# Patient Record
Sex: Male | Born: 1975 | Race: White | Hispanic: No | Marital: Married | Smoking: Never smoker
Health system: Southern US, Community
[De-identification: ages and names within clinical notes are randomized; demographics above are authoritative.]

## PROBLEM LIST (undated history)

## (undated) DIAGNOSIS — E559 Vitamin D deficiency, unspecified: Secondary | ICD-10-CM

## (undated) DIAGNOSIS — G4733 Obstructive sleep apnea (adult) (pediatric): Secondary | ICD-10-CM

## (undated) DIAGNOSIS — E291 Testicular hypofunction: Secondary | ICD-10-CM

## (undated) DIAGNOSIS — Z9989 Dependence on other enabling machines and devices: Secondary | ICD-10-CM

## (undated) DIAGNOSIS — R7989 Other specified abnormal findings of blood chemistry: Secondary | ICD-10-CM

## (undated) DIAGNOSIS — I10 Essential (primary) hypertension: Secondary | ICD-10-CM

## (undated) DIAGNOSIS — G43909 Migraine, unspecified, not intractable, without status migrainosus: Secondary | ICD-10-CM

## (undated) DIAGNOSIS — E669 Obesity, unspecified: Secondary | ICD-10-CM

## (undated) DIAGNOSIS — F419 Anxiety disorder, unspecified: Secondary | ICD-10-CM

## (undated) DIAGNOSIS — R945 Abnormal results of liver function studies: Secondary | ICD-10-CM

---

## 2001-08-28 ENCOUNTER — Emergency Department (HOSPITAL_COMMUNITY): Admission: EM | Admit: 2001-08-28 | Discharge: 2001-08-28 | Payer: Self-pay | Admitting: *Deleted

## 2008-02-05 ENCOUNTER — Emergency Department (HOSPITAL_COMMUNITY): Admission: EM | Admit: 2008-02-05 | Discharge: 2008-02-05 | Payer: Self-pay | Admitting: Emergency Medicine

## 2010-12-01 LAB — CBC
HCT: 43.1 % (ref 39.0–52.0)
MCV: 97.1 fL (ref 78.0–100.0)
RBC: 4.44 MIL/uL (ref 4.22–5.81)
WBC: 10.6 10*3/uL — ABNORMAL HIGH (ref 4.0–10.5)

## 2010-12-01 LAB — URINALYSIS, ROUTINE W REFLEX MICROSCOPIC
Bilirubin Urine: NEGATIVE
Hgb urine dipstick: NEGATIVE
Protein, ur: NEGATIVE mg/dL
Urobilinogen, UA: 0.2 mg/dL (ref 0.0–1.0)

## 2010-12-01 LAB — DIFFERENTIAL
Eosinophils Absolute: 0 10*3/uL (ref 0.0–0.7)
Eosinophils Relative: 0 % (ref 0–5)
Lymphs Abs: 1.5 10*3/uL (ref 0.7–4.0)
Monocytes Absolute: 1 10*3/uL (ref 0.1–1.0)
Monocytes Relative: 10 % (ref 3–12)

## 2010-12-01 LAB — POCT I-STAT, CHEM 8
Creatinine, Ser: 0.9 mg/dL (ref 0.4–1.5)
Glucose, Bld: 131 mg/dL — ABNORMAL HIGH (ref 70–99)
Hemoglobin: 16 g/dL (ref 13.0–17.0)
TCO2: 24 mmol/L (ref 0–100)

## 2014-10-01 ENCOUNTER — Emergency Department (HOSPITAL_COMMUNITY): Payer: Self-pay

## 2014-10-01 ENCOUNTER — Inpatient Hospital Stay (HOSPITAL_COMMUNITY)
Admission: EM | Admit: 2014-10-01 | Discharge: 2014-10-04 | DRG: 102 | Disposition: A | Discharge status: Home / Self Care | Payer: Self-pay | Attending: Neurology | Admitting: Neurology

## 2014-10-01 ENCOUNTER — Inpatient Hospital Stay (HOSPITAL_COMMUNITY): Payer: Self-pay | Admitting: Anesthesiology

## 2014-10-01 ENCOUNTER — Inpatient Hospital Stay (HOSPITAL_COMMUNITY): Payer: Self-pay

## 2014-10-01 ENCOUNTER — Encounter (HOSPITAL_COMMUNITY): Payer: Self-pay | Admitting: Emergency Medicine

## 2014-10-01 ENCOUNTER — Encounter (HOSPITAL_COMMUNITY)
Admission: EM | Disposition: A | Discharge status: Home / Self Care | Payer: Self-pay | Source: Home / Self Care | Attending: Neurology

## 2014-10-01 DIAGNOSIS — M542 Cervicalgia: Secondary | ICD-10-CM | POA: Diagnosis present

## 2014-10-01 DIAGNOSIS — G459 Transient cerebral ischemic attack, unspecified: Secondary | ICD-10-CM

## 2014-10-01 DIAGNOSIS — J9600 Acute respiratory failure, unspecified whether with hypoxia or hypercapnia: Secondary | ICD-10-CM

## 2014-10-01 DIAGNOSIS — E291 Testicular hypofunction: Secondary | ICD-10-CM | POA: Diagnosis present

## 2014-10-01 DIAGNOSIS — R51 Headache: Secondary | ICD-10-CM

## 2014-10-01 DIAGNOSIS — Z79899 Other long term (current) drug therapy: Secondary | ICD-10-CM

## 2014-10-01 DIAGNOSIS — R4182 Altered mental status, unspecified: Secondary | ICD-10-CM | POA: Diagnosis present

## 2014-10-01 DIAGNOSIS — E669 Obesity, unspecified: Secondary | ICD-10-CM | POA: Diagnosis present

## 2014-10-01 DIAGNOSIS — G43911 Migraine, unspecified, intractable, with status migrainosus: Secondary | ICD-10-CM

## 2014-10-01 DIAGNOSIS — F419 Anxiety disorder, unspecified: Secondary | ICD-10-CM | POA: Diagnosis present

## 2014-10-01 DIAGNOSIS — G4733 Obstructive sleep apnea (adult) (pediatric): Secondary | ICD-10-CM | POA: Diagnosis present

## 2014-10-01 DIAGNOSIS — G43111 Migraine with aura, intractable, with status migrainosus: Principal | ICD-10-CM | POA: Diagnosis present

## 2014-10-01 DIAGNOSIS — T43615A Adverse effect of caffeine, initial encounter: Secondary | ICD-10-CM | POA: Diagnosis present

## 2014-10-01 DIAGNOSIS — Z7982 Long term (current) use of aspirin: Secondary | ICD-10-CM

## 2014-10-01 DIAGNOSIS — T50995A Adverse effect of other drugs, medicaments and biological substances, initial encounter: Secondary | ICD-10-CM | POA: Diagnosis present

## 2014-10-01 DIAGNOSIS — J95821 Acute postprocedural respiratory failure: Secondary | ICD-10-CM | POA: Diagnosis present

## 2014-10-01 DIAGNOSIS — I67841 Reversible cerebrovascular vasoconstriction syndrome: Secondary | ICD-10-CM

## 2014-10-01 DIAGNOSIS — I10 Essential (primary) hypertension: Secondary | ICD-10-CM | POA: Diagnosis present

## 2014-10-01 DIAGNOSIS — R4701 Aphasia: Secondary | ICD-10-CM

## 2014-10-01 DIAGNOSIS — I67848 Other cerebrovascular vasospasm and vasoconstriction: Secondary | ICD-10-CM

## 2014-10-01 DIAGNOSIS — R531 Weakness: Secondary | ICD-10-CM

## 2014-10-01 DIAGNOSIS — R519 Headache, unspecified: Secondary | ICD-10-CM

## 2014-10-01 DIAGNOSIS — R202 Paresthesia of skin: Secondary | ICD-10-CM | POA: Diagnosis present

## 2014-10-01 DIAGNOSIS — Z9119 Patient's noncompliance with other medical treatment and regimen: Secondary | ICD-10-CM | POA: Diagnosis present

## 2014-10-01 DIAGNOSIS — E876 Hypokalemia: Secondary | ICD-10-CM | POA: Diagnosis present

## 2014-10-01 HISTORY — DX: Essential (primary) hypertension: I10

## 2014-10-01 HISTORY — DX: Migraine, unspecified, not intractable, without status migrainosus: G43.909

## 2014-10-01 HISTORY — PX: RADIOLOGY WITH ANESTHESIA: SHX6223

## 2014-10-01 HISTORY — DX: Testicular hypofunction: E29.1

## 2014-10-01 HISTORY — DX: Obstructive sleep apnea (adult) (pediatric): G47.33

## 2014-10-01 HISTORY — DX: Anxiety disorder, unspecified: F41.9

## 2014-10-01 HISTORY — DX: Obesity, unspecified: E66.9

## 2014-10-01 HISTORY — DX: Other specified abnormal findings of blood chemistry: R79.89

## 2014-10-01 HISTORY — DX: Abnormal results of liver function studies: R94.5

## 2014-10-01 HISTORY — DX: Dependence on other enabling machines and devices: Z99.89

## 2014-10-01 HISTORY — DX: Vitamin D deficiency, unspecified: E55.9

## 2014-10-01 LAB — COMPREHENSIVE METABOLIC PANEL
ALT: 151 U/L — ABNORMAL HIGH (ref 17–63)
ANION GAP: 13 (ref 5–15)
AST: 92 U/L — AB (ref 15–41)
Albumin: 4.3 g/dL (ref 3.5–5.0)
Alkaline Phosphatase: 42 U/L (ref 38–126)
BUN: 11 mg/dL (ref 6–20)
CALCIUM: 9.6 mg/dL (ref 8.9–10.3)
CHLORIDE: 103 mmol/L (ref 101–111)
CO2: 22 mmol/L (ref 22–32)
CREATININE: 1.04 mg/dL (ref 0.61–1.24)
GFR calc Af Amer: >60 mL/min (ref >60)
GLUCOSE: 140 mg/dL — AB (ref 65–99)
Potassium: 3.6 mmol/L (ref 3.5–5.1)
SODIUM: 138 mmol/L (ref 135–145)
Total Bilirubin: 1.5 mg/dL — ABNORMAL HIGH (ref 0.3–1.2)
Total Protein: 7.6 g/dL (ref 6.5–8.1)

## 2014-10-01 LAB — DIFFERENTIAL
Basophils Absolute: 0 10*3/uL (ref 0.0–0.1)
Basophils Relative: 0 % (ref 0–1)
EOS ABS: 0.1 10*3/uL (ref 0.0–0.7)
EOS PCT: 1 % (ref 0–5)
LYMPHS PCT: 26 % (ref 12–46)
Lymphs Abs: 2.7 10*3/uL (ref 0.7–4.0)
MONOS PCT: 13 % — AB (ref 3–12)
Monocytes Absolute: 1.3 10*3/uL — ABNORMAL HIGH (ref 0.1–1.0)
NEUTROS PCT: 60 % (ref 43–77)
Neutro Abs: 6 10*3/uL (ref 1.7–7.7)

## 2014-10-01 LAB — BLOOD GAS, ARTERIAL
Acid-base deficit: 2 mmol/L (ref 0.0–2.0)
Bicarbonate: 23.2 mEq/L (ref 20.0–24.0)
Drawn by: 252031
FIO2: 1
LHR: 15 {breaths}/min
MECHVT: 620 mL
O2 Saturation: 99.7 %
PEEP: 5 cmH2O
PO2 ART: 322 mmHg — AB (ref 80.0–100.0)
Patient temperature: 98.6
TCO2: 24.6 mmol/L (ref 0–100)
pCO2 arterial: 46.5 mmHg — ABNORMAL HIGH (ref 35.0–45.0)
pH, Arterial: 7.319 — ABNORMAL LOW (ref 7.350–7.450)

## 2014-10-01 LAB — MRSA PCR SCREENING: MRSA BY PCR: NEGATIVE

## 2014-10-01 LAB — I-STAT CHEM 8, ED
BUN: 11 mg/dL (ref 6–20)
Calcium, Ion: 1.06 mmol/L — ABNORMAL LOW (ref 1.12–1.23)
Chloride: 100 mmol/L — ABNORMAL LOW (ref 101–111)
Creatinine, Ser: 1 mg/dL (ref 0.61–1.24)
Glucose, Bld: 144 mg/dL — ABNORMAL HIGH (ref 65–99)
HCT: 51 % (ref 39.0–52.0)
Hemoglobin: 17.3 g/dL — ABNORMAL HIGH (ref 13.0–17.0)
POTASSIUM: 3.4 mmol/L — AB (ref 3.5–5.1)
Sodium: 138 mmol/L (ref 135–145)
TCO2: 19 mmol/L (ref 0–100)

## 2014-10-01 LAB — PROTIME-INR
INR: 1.06 (ref 0.00–1.49)
PROTHROMBIN TIME: 14 s (ref 11.6–15.2)

## 2014-10-01 LAB — I-STAT TROPONIN, ED: Troponin i, poc: 0 ng/mL (ref 0.00–0.08)

## 2014-10-01 LAB — CBC
HEMATOCRIT: 47.8 % (ref 39.0–52.0)
Hemoglobin: 16.8 g/dL (ref 13.0–17.0)
MCH: 35.2 pg — ABNORMAL HIGH (ref 26.0–34.0)
MCHC: 35.1 g/dL (ref 30.0–36.0)
MCV: 100.2 fL — ABNORMAL HIGH (ref 78.0–100.0)
Platelets: 216 10*3/uL (ref 150–400)
RBC: 4.77 MIL/uL (ref 4.22–5.81)
RDW: 13.3 % (ref 11.5–15.5)
WBC: 10.1 10*3/uL (ref 4.0–10.5)

## 2014-10-01 LAB — APTT: APTT: 26 s (ref 24–37)

## 2014-10-01 LAB — ETHANOL: Alcohol, Ethyl (B): <5 mg/dL (ref <5)

## 2014-10-01 SURGERY — RADIOLOGY WITH ANESTHESIA
Anesthesia: General

## 2014-10-01 MED ORDER — SUCCINYLCHOLINE CHLORIDE 20 MG/ML IJ SOLN
INTRAMUSCULAR | Status: DC | PRN
  Administered 2014-10-01: 140 mg via INTRAVENOUS

## 2014-10-01 MED ORDER — FENTANYL CITRATE (PF) 100 MCG/2ML IJ SOLN
INTRAMUSCULAR | Status: DC | PRN
  Administered 2014-10-01: 200 ug via INTRAVENOUS
  Administered 2014-10-01: 100 ug via INTRAVENOUS
  Administered 2014-10-01: 150 ug via INTRAVENOUS
  Administered 2014-10-01: 100 ug via INTRAVENOUS

## 2014-10-01 MED ORDER — NICARDIPINE HCL IN NACL 20-0.86 MG/200ML-% IV SOLN
5.0000 mg/h | INTRAVENOUS | Status: DC
  Filled 2014-10-01: qty 200

## 2014-10-01 MED ORDER — HEPARIN SODIUM (PORCINE) 1000 UNIT/ML IJ SOLN
INTRAMUSCULAR | Status: DC | PRN
  Administered 2014-10-01: 3000 [IU] via INTRAVENOUS
  Administered 2014-10-01: 1000 [IU] via INTRAVENOUS

## 2014-10-01 MED ORDER — FENTANYL CITRATE (PF) 100 MCG/2ML IJ SOLN
100.0000 ug | INTRAMUSCULAR | Status: DC | PRN
Start: 2014-10-01 — End: 2014-10-02

## 2014-10-01 MED ORDER — PROCHLORPERAZINE EDISYLATE 5 MG/ML IJ SOLN
10.0000 mg | Freq: Once | INTRAMUSCULAR | Status: AC
  Administered 2014-10-01: 10 mg via INTRAVENOUS
  Filled 2014-10-01: qty 2

## 2014-10-01 MED ORDER — GADOBENATE DIMEGLUMINE 529 MG/ML IV SOLN
20.0000 mL | Freq: Once | INTRAVENOUS | Status: AC | PRN
  Administered 2014-10-01: 20 mL via INTRAVENOUS

## 2014-10-01 MED ORDER — ONDANSETRON HCL 4 MG/2ML IJ SOLN
4.0000 mg | Freq: Once | INTRAMUSCULAR | Status: AC
  Administered 2014-10-01: 4 mg via INTRAVENOUS
  Filled 2014-10-01: qty 2

## 2014-10-01 MED ORDER — SODIUM CHLORIDE 0.9 % IV SOLN
INTRAVENOUS | Status: DC | PRN
  Administered 2014-10-01: 17:00:00 via INTRAVENOUS

## 2014-10-01 MED ORDER — LIDOCAINE HCL (CARDIAC) 20 MG/ML IV SOLN
INTRAVENOUS | Status: DC | PRN
  Administered 2014-10-01: 80 mg via INTRAVENOUS

## 2014-10-01 MED ORDER — HYDROMORPHONE HCL 1 MG/ML IJ SOLN
1.0000 mg | Freq: Once | INTRAMUSCULAR | Status: DC
  Filled 2014-10-01: qty 1

## 2014-10-01 MED ORDER — VERAPAMIL HCL 2.5 MG/ML IV SOLN
INTRAVENOUS | Status: DC
  Filled 2014-10-01 (×18): qty 50

## 2014-10-01 MED ORDER — DEXTROSE 5 % IV SOLN
10.0000 mg | INTRAVENOUS | Status: DC | PRN
  Administered 2014-10-01: 15 ug/min via INTRAVENOUS

## 2014-10-01 MED ORDER — SODIUM CHLORIDE 0.9 % IV SOLN: INTRAVENOUS | Status: DC

## 2014-10-01 MED ORDER — LORAZEPAM 2 MG/ML IJ SOLN
2.0000 mg | Freq: Once | INTRAMUSCULAR | Status: AC
  Administered 2014-10-01: 2 mg via INTRAVENOUS
  Filled 2014-10-01: qty 1

## 2014-10-01 MED ORDER — PROPOFOL 1000 MG/100ML IV EMUL
0.0000 ug/kg/min | INTRAVENOUS | Status: DC
  Administered 2014-10-01: 50 ug/kg/min via INTRAVENOUS
  Administered 2014-10-01: 20 ug/kg/min via INTRAVENOUS
  Administered 2014-10-02: 45 ug/kg/min via INTRAVENOUS
  Administered 2014-10-02: 50 ug/kg/min via INTRAVENOUS
  Filled 2014-10-01 (×3): qty 100

## 2014-10-01 MED ORDER — VANCOMYCIN HCL IN DEXTROSE 1-5 GM/200ML-% IV SOLN
1000.0000 mg | INTRAVENOUS | Status: AC
  Administered 2014-10-01: 1000 mg via INTRAVENOUS
  Filled 2014-10-01 (×2): qty 200

## 2014-10-01 MED ORDER — ACETAMINOPHEN 500 MG PO TABS
1000.0000 mg | ORAL_TABLET | Freq: Four times a day (QID) | ORAL | Status: DC | PRN
  Administered 2014-10-02 – 2014-10-04 (×2): 1000 mg via ORAL
  Filled 2014-10-01 (×2): qty 2

## 2014-10-01 MED ORDER — PROPOFOL 10 MG/ML IV BOLUS
INTRAVENOUS | Status: DC | PRN
  Administered 2014-10-01: 200 mg via INTRAVENOUS
  Administered 2014-10-01: 20 mg via INTRAVENOUS
  Administered 2014-10-01: 100 mg via INTRAVENOUS

## 2014-10-01 MED ORDER — ONDANSETRON HCL 4 MG/2ML IJ SOLN: 4.0000 mg | Freq: Four times a day (QID) | INTRAMUSCULAR | Status: DC | PRN

## 2014-10-01 MED ORDER — MAGNESIUM SULFATE IN D5W 10-5 MG/ML-% IV SOLN
1.0000 g | Freq: Once | INTRAVENOUS | Status: AC
  Administered 2014-10-01: 1 g via INTRAVENOUS
  Filled 2014-10-01: qty 100

## 2014-10-01 MED ORDER — SODIUM CHLORIDE 0.9 % IV SOLN: INTRAVENOUS | Status: DC | PRN

## 2014-10-01 MED ORDER — DIPHENHYDRAMINE HCL 50 MG/ML IJ SOLN: 25.0000 mg | Freq: Three times a day (TID) | INTRAMUSCULAR | Status: DC | PRN

## 2014-10-01 MED ORDER — SODIUM CHLORIDE 0.9 % IV SOLN
INTRAVENOUS | Status: DC
  Administered 2014-10-01 – 2014-10-03 (×3): via INTRAVENOUS

## 2014-10-01 MED ORDER — PANTOPRAZOLE SODIUM 40 MG IV SOLR
40.0000 mg | Freq: Every day | INTRAVENOUS | Status: DC
  Administered 2014-10-02 (×2): 40 mg via INTRAVENOUS
  Filled 2014-10-01 (×2): qty 40

## 2014-10-01 MED ORDER — LIDOCAINE HCL 1 % IJ SOLN
INTRAMUSCULAR | Status: AC
  Filled 2014-10-01: qty 20

## 2014-10-01 MED ORDER — PROPOFOL 1000 MG/100ML IV EMUL
INTRAVENOUS | Status: AC
  Filled 2014-10-01: qty 100

## 2014-10-01 MED ORDER — DIPHENHYDRAMINE HCL 50 MG/ML IJ SOLN
50.0000 mg | Freq: Once | INTRAMUSCULAR | Status: AC
  Administered 2014-10-01: 50 mg via INTRAVENOUS
  Filled 2014-10-01: qty 1

## 2014-10-01 MED ORDER — IOHEXOL 300 MG/ML  SOLN
150.0000 mL | Freq: Once | INTRAMUSCULAR | Status: AC | PRN
  Administered 2014-10-01: 100 mL via INTRA_ARTERIAL

## 2014-10-01 MED ORDER — ROCURONIUM BROMIDE 100 MG/10ML IV SOLN
INTRAVENOUS | Status: DC | PRN
  Administered 2014-10-01 (×2): 50 mg via INTRAVENOUS

## 2014-10-01 MED ORDER — ENOXAPARIN SODIUM 40 MG/0.4ML ~~LOC~~ SOLN
40.0000 mg | SUBCUTANEOUS | Status: DC
  Administered 2014-10-02 – 2014-10-03 (×3): 40 mg via SUBCUTANEOUS
  Filled 2014-10-01 (×5): qty 0.4

## 2014-10-01 MED ORDER — SODIUM CHLORIDE 0.9 % IV BOLUS (SEPSIS)
1000.0000 mL | Freq: Once | INTRAVENOUS | Status: AC
  Administered 2014-10-01: 1000 mL via INTRAVENOUS

## 2014-10-01 MED ORDER — METOCLOPRAMIDE HCL 5 MG/ML IJ SOLN
10.0000 mg | Freq: Three times a day (TID) | INTRAMUSCULAR | Status: DC | PRN
  Filled 2014-10-01: qty 2

## 2014-10-01 MED ORDER — ACETAMINOPHEN 650 MG RE SUPP: 650.0000 mg | Freq: Four times a day (QID) | RECTAL | Status: DC | PRN

## 2014-10-01 MED ORDER — STROKE: EARLY STAGES OF RECOVERY BOOK
Freq: Once | Status: DC
  Filled 2014-10-01: qty 1

## 2014-10-01 MED ORDER — FENTANYL CITRATE (PF) 100 MCG/2ML IJ SOLN
100.0000 ug | INTRAMUSCULAR | Status: DC | PRN
  Administered 2014-10-01 (×2): 100 ug via INTRAVENOUS
  Filled 2014-10-01 (×2): qty 2

## 2014-10-01 MED ORDER — SENNOSIDES-DOCUSATE SODIUM 8.6-50 MG PO TABS
1.0000 | ORAL_TABLET | Freq: Every evening | ORAL | Status: DC | PRN
  Filled 2014-10-01: qty 1

## 2014-10-01 NOTE — Code Documentation (Signed)
39 year old male presents to [facility] as code stroke.  Called in field by EMS.  Family reports patient has been having intermittent sx of headache, bil arm numbness and tingling for 8 days.  Has been seeing PCP PA who has been treating him for migraines.  He has hx of migraines in his 20's but none in last 10 years or so.  Mother reports that this AM around 0930  he was having trouble finding his words and c/o headache.  Patient with expressive aphasia - seems to understand and follow commands but very anxious and grabbing head in pain.  He is unable to clearly inform us of his state this AM with waking - family clearly endorsing on and off sx for 8 days - therefore LSW unable to be determined.  Focal deficits of aphasia - missed the questions and possibly some sensation loss left side.  NIHSS scored 5.  Treated with Benadryl and Compazine for migraine.  Some short term relief.  Family reports new medicine Relpax started yesterday.  Code stroke cancelled per Dr. Hosie Poisson -  Given 2 mg Ativan IV for MRI - resting quietly -  transported to MRI - handoff to [employer].

## 2014-10-01 NOTE — ED Notes (Signed)
Pt from home via [facility] with c/o migraine x 8 days.  Pt denies pain at this time but has been mute since arrival.  EMS reports pt has intermittently been able to state presidents name.  LSW 0900.  Pt mother reports pt stated he had severe neck and head pain starting last night with bilateral upper and lower extremity numbness.  Hx HTN and migraines.  Pt in NAD, alert.  When asked orientation questions by this RN pt's response is to shake head "no" and states "I can't."

## 2014-10-01 NOTE — H&P (Signed)
Stroke Consult   Chief Complaint: headache, aphasia, weakness  HPI: Reginald Gray is an 39 y.o. male migraines, hypertension, anxiety presenting with 1.5 week history of severe generalized pounding headache refractory to multiple medications. Has associated nausea, photo and phonophobia. Denies any visual changes. Yesterday developed numbness and paresthesias in bilateral arms R>L. This continued today. Around 0930 today his mom noted that he was having speech difficulties. Described as difficulty getting words out and confusion. EMS notified and code stroke activated.   Patient has been treated by his PCP for his headache. Has received IV toradol and Relpax x 2 with no symptomatic benefit. CT head imaging reviewed and overall unremarkable with no acute process. In the ED received benadryl  x 1 and compazine  x 1.   Date last known well: 08/05 Time last known well: unclear tPA Given: no, outside tPA window Modified Rankin: Rankin Score=0  Past Medical History  Diagnosis Date  . Migraines   . Hypertension   . Low testosterone   . Vitamin D deficiency disease   . Anxiety   . Elevated LFTs   . Obesity   . Hypogonadism, male   . OSA on CPAP     No past surgical history on file.  No family history on file. Social History:  has no tobacco, alcohol, and drug history on file.  Allergies: No Known Allergies   (Not in a hospital admission)  ROS: Out of a complete 14 system review, the patient complains of only the following symptoms, and all other reviewed systems are negative. + headache, weakness  Physical Examination: Filed Vitals:   10/01/14 1142  BP: 185/87  Pulse: 65  Temp: 97.3 F (36.3 C)  Resp: 22   Physical Exam  Constitutional: He appears well-developed and well-nourished.  Psych: Affect appropriate to situation Eyes: No scleral injection HENT: No OP obstrucion Head: Normocephalic.  Cardiovascular: Normal  rate and regular rhythm.  Respiratory: Effort normal and breath sounds normal.  GI: Soft. Bowel sounds are normal. No distension. There is no tenderness.  Skin: WDI  Neurologic Examination: Mental Status: Agitated but alert, oriented to name only, intermittently appropriate thought though with some expressive and receptive aphasia. Intermittently will follow commands Cranial Nerves: II: unable to visualize optic discs due to photosensitivity, visual fields grossly normal, pupils equal, round, reactive to light  III,IV, VI: ptosis not present, extra-ocular motions intact bilaterally V,VII: smile symmetric, facial light touch sensation normal bilaterally VIII: hearing normal bilaterally IX,X: gag reflex present XI: trapezius strength/neck flexion strength normal bilaterally XII: tongue strength normal  Motor: Right :Upper extremityLeft: Upper extremity 5-/5 deltoid 5/5 deltoid 5-/5 biceps5/5 biceps  5-/5 triceps5/5 triceps 5-/5 hand grip5/5 hand grip Lower extremityLower extremity 5/5 hip flexor5/5 hip flexor 5/5 quadricep5/5 quadriceps  5/5 hamstrings5/5 hamstrings 5/5 plantar flexion 5/5 plantar flexion 5/5 plantar extension5/5 plantar extension Tone and bulk:normal tone throughout; no atrophy noted Sensory: decreased LT on  the right side Deep Tendon Reflexes: 2+ and symmetric throughout Plantars: Right: downgoingLeft: downgoing Cerebellar: Unable to test (not following commands) Gait: deferred  Laboratory Studies:  Basic Metabolic Panel:  Last Labs     No results for input(s): NA, K, CL, CO2, GLUCOSE, BUN, CREATININE, CALCIUM, MG, PHOS in the last 168 hours.    Liver Function Tests:  Last Labs     No results for input(s): AST, ALT, ALKPHOS, BILITOT, PROT, ALBUMIN in the last 168 hours.    Last Labs     No results for input(s): LIPASE, AMYLASE  in the last 168 hours.    Last Labs     No results for input(s): AMMONIA in the last 168 hours.    CBC:  Last Labs     No results for input(s): WBC, NEUTROABS, HGB, HCT, MCV, PLT in the last 168 hours.    Cardiac Enzymes:  Last Labs     No results for input(s): CKTOTAL, CKMB, CKMBINDEX, TROPONINI in the last 168 hours.    BNP:  Last Labs     Invalid input(s): POCBNP    CBG:  Last Labs     No results for input(s): GLUCAP in the last 168 hours.    Microbiology: No results found for this or any previous visit.  Coagulation Studies:  Recent Labs (last 2 labs)     No results for input(s): LABPROT, INR in the last 72 hours.     Last Labs     Urinalysis: No results for input(s): COLORURINE, LABSPEC, PHURINE, GLUCOSEU, HGBUR, BILIRUBINUR, KETONESUR, PROTEINUR, UROBILINOGEN, NITRITE, LEUKOCYTESUR in the last 168 hours.  Invalid input(s): APPERANCEUR    Lipid Panel:   Labs (Brief)    No results found for: CHOL, TRIG, HDL, CHOLHDL, VLDL, LDLCALC    HgbA1C:    Recent Labs    No results found for: HGBA1C    Urine Drug Screen:    Labs (Brief)    No results found for: LABOPIA, COCAINSCRNUR, LABBENZ, AMPHETMU, THCU, LABBARB    Alcohol Level:    Last Labs     No results for input(s): ETH in the last 168 hours.    Other results:  Imaging:  Imaging Results (Last 48 hours)    No results found.     Assessment: 39 y.o. male hx of migraines, hypertension, anxiety presenting with 1.5 week history of severe headache now with new onset of sensory, motor and speech deficits. Unclear etiology. Differential includes CVA vs complex migraine vs Dural Sinus thrombosis. Onset of neurological deficits after addition of relpax raises question of possible reversible vasoconstriction syndrome. A febrile with no leukocytosis makes infectious etiology less likely.   -Will order stat MRI with and without, MRA head and MRV brain.  -Agree with benadryl and compazine. Will order magnesium sulfate 1gm IV x 1 -Would hold on DHE or triptan therapy pending clarification of etiology of symptoms -Further workup and treatment pending imaging results   Addendum: Reviewed imaging results with neuroradiologist, Dr Dorena Dew. No signs of acute infarct or dural sinus thrombosis. 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 Concern for possible reversible cerebral vasoconstriction syndrome. Discussed with neuro-IR, Dr Corliss Skains. Will proceed with diagnostic angiogram, consider further intervention based on imaging results. Will admit to neuro-ICU and monitor closely.    This patient is critically ill and at significant risk of neurological worsening, death and care requires constant monitoring of vital signs, hemodynamics,respiratory and cardiac monitoring,review of multiple databases, neurological assessment, discussion with family, other specialists and medical decision making of high complexity. I spent 45 minutes of neurocritical care time in the care of this patient.    Elspeth Cho, DO [facility] [phone]  If 7pm- 7am, please page neurology on call as listed in AMION. 10/01/2014, 12:22 PM

## 2014-10-01 NOTE — Transfer of Care (Signed)
Immediate Anesthesia Transfer of Care Note  Patient: Reginald Gray  Procedure(s) Performed: Procedure(s): RADIOLOGY WITH ANESTHESIA (N/A)  Patient Location: ICU  Anesthesia Type:General  Level of Consciousness: sedated and Patient remains intubated per anesthesia plan  Airway & Oxygen Therapy: Patient remains intubated per anesthesia plan and Patient placed on Ventilator (see vital sign flow sheet for setting)  Post-op Assessment: Report given to RN and Post -op Vital signs reviewed and stable  Post vital signs: Reviewed and stable  Last Vitals:  Filed Vitals:   10/01/14 1700  BP: 176/83  Pulse: 76  Temp:   Resp:     Complications: No apparent anesthesia complications

## 2014-10-01 NOTE — Anesthesia Preprocedure Evaluation (Addendum)
Anesthesia Evaluation  Patient identified by MRN, date of birth, ID band Patient confused    Reviewed: Unable to perform ROS - Chart review onlyPreop documentation limited or incomplete due to emergent nature of procedure.  Airway       Comment: Obese head and neck.  Unable to fully cooperate with exam. Dental   Pulmonary sleep apnea ,  breath sounds clear to auscultation        Cardiovascular hypertension, Rhythm:regular Rate:Normal     Neuro/Psych  Headaches, Anxiety CVA    GI/Hepatic   Endo/Other    Renal/GU      Musculoskeletal   Abdominal   Peds  Hematology   Anesthesia Other Findings   Reproductive/Obstetrics                            Anesthesia Physical Anesthesia Plan  ASA: III and emergent  Anesthesia Plan: General   Post-op Pain Management:    Induction: Intravenous  Airway Management Planned: Oral ETT  Additional Equipment: Arterial line  Intra-op Plan:   Post-operative Plan: Extubation in OR and Possible Post-op intubation/ventilation  Informed Consent: I have reviewed the patients History and Physical, chart, labs and discussed the procedure including the risks, benefits and alternatives for the proposed anesthesia with the patient or authorized representative who has indicated his/her understanding and acceptance.     Plan Discussed with: CRNA, Anesthesiologist and Surgeon  Anesthesia Plan Comments:         Anesthesia Quick Evaluation

## 2014-10-01 NOTE — ED Notes (Signed)
Notified Dr Adela Lank of pt status.

## 2014-10-01 NOTE — Procedures (Signed)
S/P 4 vessel cerebral arteriogram   RT CFA approach. Findings . to moderate delayed  Hemodynamic  flow into both MCAs ,parietal branches , s/p IA administration of 4.5 mg of Verapamil into Lt ICA and 2.4 mg into RT ICA with sig improved hemodynamic flow.

## 2014-10-01 NOTE — Sedation Documentation (Signed)
Verapamil 2.4mg  given via Right internal carotid artery, and 4.5mg  given via Left internal  carotid artery.

## 2014-10-01 NOTE — ED Provider Notes (Signed)
CSN: [id number]     Arrival date & time 10/01/14  1126 History   First MD Initiated Contact with Patient 10/01/14 1142     Chief Complaint  Patient presents with  . Code Stroke  . Aphasia  . Migraine     (Consider location/radiation/quality/duration/timing/severity/associated sxs/prior Treatment) Patient is a 39 y.o. male presenting with headaches. The history is provided by the patient.  Headache Pain location:  Generalized Quality:  Sharp Radiates to:  Does not radiate Severity currently:  10/10 Severity at highest:  10/10 Onset quality:  Gradual Duration:  2 weeks Timing:  Constant Progression:  Worsening Chronicity:  New Similar to prior headaches: no   Context: bright light   Relieved by:  Nothing Worsened by:  Nothing Ineffective treatments:  None tried Associated symptoms: neck pain, paresthesias and tingling   Associated symptoms: no abdominal pain, no congestion, no diarrhea, no fever, no myalgias, no visual change and no vomiting    39 yo M with a chief complaint of headache. This been going on for at least 10 days. Patient saw his PCP 3 days ago was given 2 shots with some mild improvement. This morning patient suddenly had difficulty speaking was complaining of paresthesias generally just before that happened. Having severe photophobia and severe worsening of headache as well. Family states that he has had migraines in the past but they resolved in his 30s. Family denies head injury. History of hypertension denies history of diabetes hyperlipidemia.  Past Medical History  Diagnosis Date  . Migraines   . Hypertension   . Low testosterone   . Vitamin D deficiency disease   . Anxiety   . Elevated LFTs   . Obesity   . Hypogonadism, male   . OSA on CPAP    No past surgical history on file. No family history on file. History  Substance Use Topics  . Smoking status: Not on file  . Smokeless tobacco: Not on file  . Alcohol Use: Not on file    Review of  Systems  Unable to perform ROS: Patient nonverbal  Constitutional: Negative for fever and chills.  HENT: Negative for congestion and facial swelling.   Eyes: Negative for discharge and visual disturbance.  Respiratory: Negative for shortness of breath.   Cardiovascular: Negative for chest pain and palpitations.  Gastrointestinal: Negative for vomiting, abdominal pain and diarrhea.  Musculoskeletal: Positive for neck pain. Negative for myalgias and arthralgias.  Skin: Negative for color change and rash.  Neurological: Positive for paresthesias. Negative for tremors, syncope and headaches.  Psychiatric/Behavioral: Negative for confusion and dysphoric mood.      Allergies  Hydrochlorothiazide and Penicillins  Home Medications   Prior to Admission medications   Medication Sig Start Date End Date Taking? Authorizing Provider  aspirin-acetaminophen-caffeine (EXCEDRIN MIGRAINE) [id number] MG per tablet Take 1 tablet by mouth every 6 (six) hours as needed for headache or migraine.   Yes Historical Provider, MD  benazepril (LOTENSIN) 40 MG tablet Take 40 mg by mouth daily.    Yes Historical Provider, MD  busPIRone (BUSPAR) 5 MG tablet Take 1 tablet up to twice a day as needed for stress. 06/18/14  Yes Historical Provider, MD  carvedilol (COREG) 12.5 MG tablet Take 12.5 mg by mouth 2 (two) times daily with a meal.   Yes Historical Provider, MD  diclofenac sodium (VOLTAREN) 1 % GEL Apply 4 g topically 4 (four) times daily.   Yes Historical Provider, MD  furosemide (LASIX) 20 MG tablet Take 20 mg by  mouth daily.  06/18/14  Yes Historical Provider, MD  ketorolac (TORADOL) 30 MG/ML injection Inject 60 mg into the vein once.   Yes Historical Provider, MD  methylPREDNISolone sodium succinate (SOLU-MEDROL) 40 mg/mL injection Inject 80 mg into the vein once.   Yes Historical Provider, MD  omeprazole (PRILOSEC) 20 MG capsule Take 20 mg by mouth daily as needed (heartburn).    Yes Historical Provider, MD   testosterone (ANDROGEL) 50 MG/5GM (1%) GEL Place 5 g onto the skin daily.   Yes Historical Provider, MD  Vitamin D, Ergocalciferol, (DRISDOL) 50000 UNITS CAPS capsule TAKE 1 CAPSULE (50,000 UNITS TOTAL) BY MOUTH TWICE PER WEEK 09/24/13  Yes Historical Provider, MD   BP 164/85 mmHg  Pulse 52  Temp(Src) 97.3 F (36.3 C) (Oral)  Resp 20  SpO2 97% Physical Exam  Constitutional: He is oriented to person, place, and time. He appears well-developed and well-nourished.  HENT:  Head: Normocephalic and atraumatic.  Eyes: EOM are normal. Pupils are equal, round, and reactive to light.  Neck: Normal range of motion. Neck supple. No JVD present.  Cardiovascular: Normal rate and regular rhythm.  Exam reveals no gallop and no friction rub.   No murmur heard. Pulmonary/Chest: No respiratory distress. He has no wheezes.  Abdominal: He exhibits no distension. There is no rebound and no guarding.  Musculoskeletal: Normal range of motion.  Neurological: He is alert and oriented to person, place, and time. A cranial nerve deficit is present. GCS eye subscore is 4. GCS verbal subscore is 3. GCS motor subscore is 6. He displays no Babinski's sign on the right side. He displays no Babinski's sign on the left side.  Reflex Scores:      Patellar reflexes are 3+ on the right side and 3+ on the left side.      Achilles reflexes are 3+ on the right side and 3+ on the left side. Unable to raise posterior palate. Patient with right-sided weakness compared to left. 4 out of 5. Hyperreflexic to lower extremities 3+. Patient able to say limited words. I don't know repeated multiple times. Able to follow commands  Skin: No rash noted. No pallor.  Psychiatric: He has a normal mood and affect. His behavior is normal.    ED Course  Procedures (including critical care time) Labs Review Labs Reviewed  CBC - Abnormal; Notable for the following:    MCV 100.2 (*)    MCH 35.2 (*)    All other components within normal  limits  DIFFERENTIAL - Abnormal; Notable for the following:    Monocytes Relative 13 (*)    Monocytes Absolute 1.3 (*)    All other components within normal limits  COMPREHENSIVE METABOLIC PANEL - Abnormal; Notable for the following:    Glucose, Bld 140 (*)    AST 92 (*)    ALT 151 (*)    Total Bilirubin 1.5 (*)    All other components within normal limits  I-STAT CHEM 8, ED - Abnormal; Notable for the following:    Potassium 3.4 (*)    Chloride 100 (*)    Glucose, Bld 144 (*)    Calcium, Ion 1.06 (*)    Hemoglobin 17.3 (*)    All other components within normal limits  ETHANOL  PROTIME-INR  APTT  URINE RAPID DRUG SCREEN, HOSP PERFORMED  URINALYSIS, ROUTINE W REFLEX MICROSCOPIC (NOT AT [facility])  Rosezena Sensor, ED    Imaging Review Ct Head Wo Contrast  10/01/2014   CLINICAL DATA:  39 year old  male code stroke. Headache for 1 week now with Broca's aphasia and right-sided weakness. Initial encounter.  EXAM: CT HEAD WITHOUT CONTRAST  TECHNIQUE: Contiguous axial images were obtained from the base of the skull through the vertex without intravenous contrast.  COMPARISON:  None.  FINDINGS: Visualized paranasal sinuses and mastoids are clear. No acute osseous abnormality identified. Visualized orbits and scalp soft tissues are within normal limits.  Cerebral volume is normal. No intracranial mass effect or hemorrhage. Gray-white matter differentiation remains within normal limits throughout the left hemisphere. No acute cortically based infarct identified. No suspicious intracranial vascular hyperdensity. No intracranial mass identified.  IMPRESSION: Normal noncontrast CT appearance of the brain.  Study discussed by telephone with Neurology Dr. Hosie Poisson on 10/01/2014 at 1222 hours.   Electronically Signed   By: Odessa Fleming M.D.   On: 10/01/2014 12:24     EKG Interpretation None      MDM   Final diagnoses:  Aphasia  Right sided weakness  Intractable migraine with status migrainosus, unspecified  migraine type    39 yo M with a chief complaint of altered mental status and change in speech. Possible brokers aphasia with difficulty saying specific words. Patient able to follow commands. Right-sided compared to left sided weakness. Likely, located migraine however with new neurologic deficits will call code stroke.  Evaluated by neurology at bedside recommend MRA MRV MRI. His initial trip he was sent back to due to noncompliance. On arrival back patient sleepy some improvement of his headache. Continued difficulty with speech.   Patient care turned over to Dr. Clarene Duke.  The patients results and plan were reviewed and discussed.   Any x-rays performed were independently reviewed by myself.   Differential diagnosis were considered with the presenting HPI.  Medications  HYDROmorphone (DILAUDID) injection 1 mg (0 mg Intravenous Hold 10/01/14 1354)  prochlorperazine (COMPAZINE) injection 10 mg (10 mg Intravenous Given 10/01/14 1223)  diphenhydrAMINE (BENADRYL) injection 50 mg (50 mg Intravenous Given 10/01/14 1223)  sodium chloride 0.9 % bolus 1,000 mL (0 mLs Intravenous Paused 10/01/14 1524)  LORazepam (ATIVAN) injection 2 mg (2 mg Intravenous Given 10/01/14 1244)  magnesium sulfate IVPB 1 g 100 mL (0 g Intravenous Paused 10/01/14 1428)  ondansetron (ZOFRAN) injection 4 mg (4 mg Intravenous Given 10/01/14 1524)  gadobenate dimeglumine (MULTIHANCE) injection 20 mL (20 mLs Intravenous Contrast Given 10/01/14 1549)    Filed Vitals:   10/01/14 1142 10/01/14 1230 10/01/14 1337 10/01/14 1345  BP: 185/87 167/86 164/85   Pulse: 65 57 55 52  Temp: 97.3 F (36.3 C)     TempSrc: Oral     Resp: 22 20    SpO2: 98% 100% 97% 97%    Final diagnoses:  Aphasia  Right sided weakness  Intractable migraine with status migrainosus, unspecified migraine type        Melene Plan, DO 10/01/14 1622

## 2014-10-01 NOTE — Anesthesia Procedure Notes (Signed)
Procedure Name: Intubation Date/Time: 10/01/2014 5:26 PM Performed by: Dairl Ponder Pre-anesthesia Checklist: Patient identified, Emergency Drugs available, Suction available, Patient being monitored and Timeout performed Patient Re-evaluated:Patient Re-evaluated prior to inductionOxygen Delivery Method: Circle system utilized Preoxygenation: Pre-oxygenation with 100% oxygen Intubation Type: IV induction, Cricoid Pressure applied and Rapid sequence Laryngoscope Size: Mac and 4 Grade View: Grade III Tube type: Subglottic suction tube Tube size: 8.0 mm Number of attempts: 1 Placement Confirmation: ETT inserted through vocal cords under direct vision,  positive ETCO2 and breath sounds checked- equal and bilateral Secured at: 23 cm Tube secured with: Tape Dental Injury: Teeth and Oropharynx as per pre-operative assessment

## 2014-10-01 NOTE — Anesthesia Postprocedure Evaluation (Signed)
  Anesthesia Post-op Note  Patient: Reginald Gray  Procedure(s) Performed: Procedure(s): RADIOLOGY WITH ANESTHESIA (N/A)  Patient Location: ICU  Anesthesia Type:General  Level of Consciousness: Patient remains intubated per anesthesia plan  Airway and Oxygen Therapy: Patient remains intubated per anesthesia plan  Post-op Pain: none  Post-op Assessment: Post-op Vital signs reviewed              Post-op Vital Signs: Reviewed  Last Vitals:  Filed Vitals:   10/01/14 2100  BP: 109/58  Pulse: 94  Temp:   Resp: 30    Complications: No apparent anesthesia complications

## 2014-10-01 NOTE — Consult Note (Signed)
Name: Reginald Gray MRN: [id number] DOB: 19-Dec-1975    ADMISSION DATE:  10/01/2014 CONSULTATION DATE:  10/01/2014  REFERRING MD :  Dr. Hosie Poisson  CHIEF COMPLAINT:  Cerebral vasospasm.  BRIEF PATIENT DESCRIPTION:  CCM consultation for ventilator management.  SIGNIFICANT EVENTS  S/p IR guided verapamil injection  HISTORY OF PRESENT ILLNESS:   48M who is intubated and sedated, no family members, history is adapted from chart review.  History of migraines and hypertension who presented with 1.5 weeks of severe generalized headache a/w nausea, photophobia.  Yesterday symptoms progressed to numbness and paresthesia of both arms.  Today 0930 slurred speech noted by patients mother with confusion.  Focal deficits confirmed by EMS  Reginald Gray is an 39 y.o. male migraines, hypertension, anxiety presenting with 1.5 week history of severe generalized pounding headache refractory to multiple medications. Has associated nausea, photo and phonophobia. Denies any visual changes. Yesterday developed numbness and paresthesias in bilateral arms R>L. This continued today. Around 0930 today his mom noted that he was having speech difficulties. Described as difficulty getting words out and confusion. EMS notified and code stroke activated.   In the ED received benadryl 50mg  x 1 and compazine 10mg  x 1 with unremarkable CT head.  MRI demonstrated no CVA but possible reversible cerebral vasospasm of the L superior cerebellar artery.  He was taken to IR where he received intra-arterial verapamil to b/l ICA with marked improvement of hemodynamic flow.  Currently he remains intubated and sedated.   PAST MEDICAL HISTORY :   has a past medical history of Migraines; Hypertension; Low testosterone; Vitamin D deficiency disease; Anxiety; Elevated LFTs; Obesity; Hypogonadism, male; and OSA on CPAP.  has no past surgical history on file. Prior to Admission medications   Medication Sig Start Date End Date Taking?  Authorizing Provider  aspirin-acetaminophen-caffeine (EXCEDRIN MIGRAINE) [phone] MG per tablet Take 1 tablet by mouth every 6 (six) hours as needed for headache or migraine.   Yes Historical Provider, MD  benazepril (LOTENSIN) 40 MG tablet Take 40 mg by mouth daily.    Yes Historical Provider, MD  busPIRone (BUSPAR) 5 MG tablet Take 1 tablet up to twice a day as needed for stress. 06/18/14  Yes Historical Provider, MD  carvedilol (COREG) 12.5 MG tablet Take 12.5 mg by mouth 2 (two) times daily with a meal.   Yes Historical Provider, MD  diclofenac sodium (VOLTAREN) 1 % GEL Apply 4 g topically 4 (four) times daily.   Yes Historical Provider, MD  furosemide (LASIX) 20 MG tablet Take 20 mg by mouth daily.  06/18/14  Yes Historical Provider, MD  ketorolac (TORADOL) 30 MG/ML injection Inject 60 mg into the vein once.   Yes Historical Provider, MD  methylPREDNISolone sodium succinate (SOLU-MEDROL) 40 mg/mL injection Inject 80 mg into the vein once.   Yes Historical Provider, MD  omeprazole (PRILOSEC) 20 MG capsule Take 20 mg by mouth daily as needed (heartburn).    Yes Historical Provider, MD  testosterone (ANDROGEL) 50 MG/5GM (1%) GEL Place 5 g onto the skin daily.   Yes Historical Provider, MD  Vitamin D, Ergocalciferol, (DRISDOL) 50000 UNITS CAPS capsule TAKE 1 CAPSULE (50,000 UNITS TOTAL) BY MOUTH TWICE PER WEEK 09/24/13  Yes Historical Provider, MD   Allergies  Allergen Reactions  . Hydrochlorothiazide Other (See Comments)    Feels sick.  . Penicillins Other (See Comments)    FAMILY HISTORY:  family history is not on file. SOCIAL HISTORY:    REVIEW OF SYSTEMS:  Constitutional: Negative for fever, chills, weight loss, malaise/fatigue and diaphoresis.  HENT: Negative for hearing loss, ear pain, nosebleeds, congestion, sore throat, neck pain, tinnitus and ear discharge.   Eyes: Negative for blurred vision, double vision, photophobia, pain, discharge and redness.  Respiratory: Negative for  cough, hemoptysis, sputum production, shortness of breath, wheezing and stridor.   Cardiovascular: Negative for chest pain, palpitations, orthopnea, claudication, leg swelling and PND.  Gastrointestinal: Negative for heartburn, nausea, vomiting, abdominal pain, diarrhea, constipation, blood in stool and melena.  Genitourinary: Negative for dysuria, urgency, frequency, hematuria and flank pain.  Musculoskeletal: Negative for myalgias, back pain, joint pain and falls.  Skin: Negative for itching and rash.  Neurological: Negative for dizziness, tingling, tremors, sensory change, speech change, focal weakness, seizures, loss of consciousness, weakness and headaches.  Endo/Heme/Allergies: Negative for environmental allergies and polydipsia. Does not bruise/bleed easily.  SUBJECTIVE:   VITAL SIGNS: Temp:  [97.3 F (36.3 C)] 97.3 F (36.3 C) (08/05 1142) Pulse Rate:  [52-94] 94 (08/05 2100) Resp:  [20-30] 30 (08/05 2100) BP: (109-185)/(58-90) 109/58 mmHg (08/05 2100) SpO2:  [95 %-100 %] 100 % (08/05 2100) Arterial Line BP: (134)/(68) 134/68 mmHg (08/05 2100) FiO2 (%):  [100 %] 100 % (08/05 2100) Weight:  [136.9 kg (301 lb 13 oz)] 136.9 kg (301 lb 13 oz) (08/05 2100)  PHYSICAL EXAMINATION: General:  Sedated, intubated Neuro:  PERLA, no DTRs, normal bulk and tone HEENT:  NCAT, ETT in place Cardiovascular:  RRR, s1/s2 no m/r/g Lungs:  CTA b/l no w/r/r Abdomen:  Soft, absent bowel sounds, soft Musculoskeletal:  Normal bulk and tone Skin:  No rashes.   Recent Labs Lab 10/01/14 1225  NA 138  138  K 3.6  3.4*  CL 103  100*  CO2 22  BUN 11  11  CREATININE 1.04  1.00  GLUCOSE 140*  144*    Recent Labs Lab 10/01/14 1225  HGB 16.8  17.3*  HCT 47.8  51.0  WBC 10.1  PLT 216   Ct Head Wo Contrast  10/01/2014   CLINICAL DATA:  39 year old male code stroke. Headache for 1 week now with Broca's aphasia and right-sided weakness. Initial encounter.  EXAM: CT HEAD WITHOUT CONTRAST   TECHNIQUE: Contiguous axial images were obtained from the base of the skull through the vertex without intravenous contrast.  COMPARISON:  None.  FINDINGS: Visualized paranasal sinuses and mastoids are clear. No acute osseous abnormality identified. Visualized orbits and scalp soft tissues are within normal limits.  Cerebral volume is normal. No intracranial mass effect or hemorrhage. Gray-white matter differentiation remains within normal limits throughout the left hemisphere. No acute cortically based infarct identified. No suspicious intracranial vascular hyperdensity. No intracranial mass identified.  IMPRESSION: Normal noncontrast CT appearance of the brain.  Study discussed by telephone with Neurology Dr. Hosie Poisson on 10/01/2014 at 1222 hours.   Electronically Signed   By: Odessa Fleming M.D.   On: 10/01/2014 12:24   Mr [name] Head Wo Contrast  10/01/2014   CLINICAL DATA:  Ten day history of generalized pounding headache refractory to multiple medications. Nausea and photophobia. Developed numbness and paresthesias in the arms yesterday. Speech difficulty today. Expressive and receptive aphasia on neurologic exam. Slight right-sided weakness. Decreased light touch on the RIGHT side.  EXAM: MRI HEAD WITHOUT AND WITH CONTRAST AND MRA HEAD WITHOUT AND WITH CONTRAST AND MRV INTRACRANIAL  TECHNIQUE: Multiplanar, multiecho pulse sequences of the brain and surrounding structures were obtained without and with intravenous contrast. Angiographic images of the head  were obtained using MRA technique without and with contrast. The intracranial venous structures were examined using vena graphic technique.  CONTRAST:  20mL MULTIHANCE GADOBENATE DIMEGLUMINE 529 MG/ML IV SOLN  COMPARISON:  CT head earlier today.  FINDINGS: MRI HEAD FINDINGS  No evidence for acute infarction, hemorrhage, mass lesion, hydrocephalus, or extra-axial fluid. Normal cerebral volume. No significant white matter disease. No large vessel or lacunar infarct.   Pituitary, pineal, and cerebellar tonsils unremarkable. No upper cervical lesions.  Flow voids are maintained throughout the carotid, basilar, and vertebral arteries. There are no areas of chronic hemorrhage.  Post infusion, no abnormal enhancement of the brain or meninges.  Visualized calvarium, skull base, and upper cervical osseous structures unremarkable. Scalp and extracranial soft tissues, orbits, sinuses, and mastoids show no acute process.  MRA HEAD FINDINGS  The internal carotid arteries are widely patent. The basilar artery is widely patent with vertebrals codominant.  Moderate to severe narrowing of the distal LEFT PCA and distal LEFT MCA branches. There may be slight irregularity of the proximal LEFT MCA M2 branches. No cerebellar branch occlusion but moderate to severe irregularity of the LEFT superior cerebellar artery. Findings concerning for reversible cerebral vasoconstriction syndrome (RCVS). No intracranial aneurysm.  MR VENOGRAM FINDINGS  The superior sagittal sinus is widely patent. The RIGHT transverse and sigmoid sinus are dominant with hypoplasia of the LEFT transverse sinus. LEFT sigmoid sinus is hypoplastic but patent. Deep venous system is patent.  IMPRESSION: No acute stroke.  No subtle signs of subarachnoid blood.  Moderate to severe narrowing of the distal LEFT PCA and distal LEFT MCA branches as well as the LEFT superior cerebellar artery. Concern raised for reversible cerebral vasoconstriction syndrome.  Hypoplastic LEFT transverse sinus without signs of acute venous sinus thrombosis.   Electronically Signed   By: Elsie Stain M.D.   On: 10/01/2014 16:25   Mr Laqueta Jean JX Contrast  10/01/2014   CLINICAL DATA:  Ten day history of generalized pounding headache refractory to multiple medications. Nausea and photophobia. Developed numbness and paresthesias in the arms yesterday. Speech difficulty today. Expressive and receptive aphasia on neurologic exam. Slight right-sided weakness.  Decreased light touch on the RIGHT side.  EXAM: MRI HEAD WITHOUT AND WITH CONTRAST AND MRA HEAD WITHOUT AND WITH CONTRAST AND MRV INTRACRANIAL  TECHNIQUE: Multiplanar, multiecho pulse sequences of the brain and surrounding structures were obtained without and with intravenous contrast. Angiographic images of the head were obtained using MRA technique without and with contrast. The intracranial venous structures were examined using vena graphic technique.  CONTRAST:  20mL MULTIHANCE GADOBENATE DIMEGLUMINE 529 MG/ML IV SOLN  COMPARISON:  CT head earlier today.  FINDINGS: MRI HEAD FINDINGS  No evidence for acute infarction, hemorrhage, mass lesion, hydrocephalus, or extra-axial fluid. Normal cerebral volume. No significant white matter disease. No large vessel or lacunar infarct.  Pituitary, pineal, and cerebellar tonsils unremarkable. No upper cervical lesions.  Flow voids are maintained throughout the carotid, basilar, and vertebral arteries. There are no areas of chronic hemorrhage.  Post infusion, no abnormal enhancement of the brain or meninges.  Visualized calvarium, skull base, and upper cervical osseous structures unremarkable. Scalp and extracranial soft tissues, orbits, sinuses, and mastoids show no acute process.  MRA HEAD FINDINGS  The internal carotid arteries are widely patent. The basilar artery is widely patent with vertebrals codominant.  Moderate to severe narrowing of the distal LEFT PCA and distal LEFT MCA branches. There may be slight irregularity of the proximal LEFT MCA M2 branches. No  cerebellar branch occlusion but moderate to severe irregularity of the LEFT superior cerebellar artery. Findings concerning for reversible cerebral vasoconstriction syndrome (RCVS). No intracranial aneurysm.  MR VENOGRAM FINDINGS  The superior sagittal sinus is widely patent. The RIGHT transverse and sigmoid sinus are dominant with hypoplasia of the LEFT transverse sinus. LEFT sigmoid sinus is hypoplastic but  patent. Deep venous system is patent.  IMPRESSION: No acute stroke.  No subtle signs of subarachnoid blood.  Moderate to severe narrowing of the distal LEFT PCA and distal LEFT MCA branches as well as the LEFT superior cerebellar artery. Concern raised for reversible cerebral vasoconstriction syndrome.  Hypoplastic LEFT transverse sinus without signs of acute venous sinus thrombosis.   Electronically Signed   By: Elsie Stain M.D.   On: 10/01/2014 16:25   Mr Mrv Head Wo Cm  10/01/2014   CLINICAL DATA:  Ten day history of generalized pounding headache refractory to multiple medications. Nausea and photophobia. Developed numbness and paresthesias in the arms yesterday. Speech difficulty today. Expressive and receptive aphasia on neurologic exam. Slight right-sided weakness. Decreased light touch on the RIGHT side.  EXAM: MRI HEAD WITHOUT AND WITH CONTRAST AND MRA HEAD WITHOUT AND WITH CONTRAST AND MRV INTRACRANIAL  TECHNIQUE: Multiplanar, multiecho pulse sequences of the brain and surrounding structures were obtained without and with intravenous contrast. Angiographic images of the head were obtained using MRA technique without and with contrast. The intracranial venous structures were examined using vena graphic technique.  CONTRAST:  20mL MULTIHANCE GADOBENATE DIMEGLUMINE 529 MG/ML IV SOLN  COMPARISON:  CT head earlier today.  FINDINGS: MRI HEAD FINDINGS  No evidence for acute infarction, hemorrhage, mass lesion, hydrocephalus, or extra-axial fluid. Normal cerebral volume. No significant white matter disease. No large vessel or lacunar infarct.  Pituitary, pineal, and cerebellar tonsils unremarkable. No upper cervical lesions.  Flow voids are maintained throughout the carotid, basilar, and vertebral arteries. There are no areas of chronic hemorrhage.  Post infusion, no abnormal enhancement of the brain or meninges.  Visualized calvarium, skull base, and upper cervical osseous structures unremarkable. Scalp and  extracranial soft tissues, orbits, sinuses, and mastoids show no acute process.  MRA HEAD FINDINGS  The internal carotid arteries are widely patent. The basilar artery is widely patent with vertebrals codominant.  Moderate to severe narrowing of the distal LEFT PCA and distal LEFT MCA branches. There may be slight irregularity of the proximal LEFT MCA M2 branches. No cerebellar branch occlusion but moderate to severe irregularity of the LEFT superior cerebellar artery. Findings concerning for reversible cerebral vasoconstriction syndrome (RCVS). No intracranial aneurysm.  MR VENOGRAM FINDINGS  The superior sagittal sinus is widely patent. The RIGHT transverse and sigmoid sinus are dominant with hypoplasia of the LEFT transverse sinus. LEFT sigmoid sinus is hypoplastic but patent. Deep venous system is patent.  IMPRESSION: No acute stroke.  No subtle signs of subarachnoid blood.  Moderate to severe narrowing of the distal LEFT PCA and distal LEFT MCA branches as well as the LEFT superior cerebellar artery. Concern raised for reversible cerebral vasoconstriction syndrome.  Hypoplastic LEFT transverse sinus without signs of acute venous sinus thrombosis.   Electronically Signed   By: Elsie Stain M.D.   On: 10/01/2014 16:25   CXR 10/01/2014: ETT 1.5-2cm above carina.  No focal defects, low lung volumes.  ASSESSMENT / PLAN:  26M with acute b/l reversible vasospasm of the ICA, intubated for airway protection.  Recommend:  - ETT in appropriate position  - Current ventilator settings: PRVC/620/14/100%/5  -  ABG now  - weak O2 as tolerated to achieve sat >88%  - elevate HOB to 30deg as neuro allows  - H2 blocker daily  - oral care  - cont propofol gtt  - cont fentanyl PRN  - Goal RASS: -1 to -2  - SBT in AM  Total critical care time: 30 min  Critical care time was exclusive of separately billable procedures and treating other patients.  Critical care was necessary to treat or prevent imminent or  life-threatening deterioration.  Critical care was time spent personally by me on the following activities: development of treatment plan with patient and/or surrogate as well as nursing, discussions with consultants, evaluation of patient's response to treatment, examination of patient, obtaining history from patient or surrogate, ordering and performing treatments and interventions, ordering and review of laboratory studies, ordering and review of radiographic studies, pulse oximetry and re-evaluation of patient's condition.   Galvin Proffer, DO., MS [facility]    Pulmonary and Critical Care Medicine [facility] Pager: [phone]  10/01/2014, 9:21 PM

## 2014-10-01 NOTE — ED Notes (Signed)
Patient transported to MRI 

## 2014-10-01 NOTE — Consult Note (Addendum)
Stroke Consult    Chief Complaint: headache, aphasia, weakness  HPI: Reginald Gray is an 39 y.o. male migraines, hypertension, anxiety presenting with 1.5 week history of severe generalized pounding headache refractory to multiple medications. Has associated nausea, photo and phonophobia. Denies any visual changes. Yesterday developed numbness and paresthesias in bilateral arms R>L. This continued today. Around 0930 today his mom noted that he was having speech difficulties. Described as difficulty getting words out and confusion. EMS notified and code stroke activated.   Patient has been treated by his PCP for his headache. Has received IV toradol and Relpax x 2 with no symptomatic benefit. CT head imaging reviewed and overall unremarkable with no acute process. In the ED received benadryl  x 1 and compazine  x 1.   Date last known well: 08/05 Time last known well: unclear tPA Given: no, outside tPA window Modified Rankin: Rankin Score=0  Past Medical History  Diagnosis Date  . Migraines   . Hypertension   . Low testosterone   . Vitamin D deficiency disease   . Anxiety   . Elevated LFTs   . Obesity   . Hypogonadism, male   . OSA on CPAP     No past surgical history on file.  No family history on file. Social History:  has no tobacco, alcohol, and drug history on file.  Allergies: No Known Allergies   (Not in a hospital admission)  ROS: Out of a complete 14 system review, the patient complains of only the following symptoms, and all other reviewed systems are negative. + headache, weakness  Physical Examination: Filed Vitals:   10/01/14 1142  BP: 185/87  Pulse: 65  Temp: 97.3 F (36.3 C)  Resp: 22   Physical Exam  Constitutional: He appears well-developed and well-nourished.  Psych: Affect appropriate to situation Eyes: No scleral injection HENT: No OP obstrucion Head: Normocephalic.  Cardiovascular: Normal rate and regular rhythm.  Respiratory:  Effort normal and breath sounds normal.  GI: Soft. Bowel sounds are normal. No distension. There is no tenderness.  Skin: WDI  Neurologic Examination: Mental Status: Agitated but alert, oriented to name only, intermittently appropriate thought though with some expressive and receptive aphasia. Intermittently will follow commands Cranial Nerves: II: unable to visualize optic discs due to photosensitivity, visual fields grossly normal, pupils equal, round, reactive to light  III,IV, VI: ptosis not present, extra-ocular motions intact bilaterally V,VII: smile symmetric, facial light touch sensation normal bilaterally VIII: hearing normal bilaterally IX,X: gag reflex present XI: trapezius strength/neck flexion strength normal bilaterally XII: tongue strength normal  Motor: Right : Upper extremity    Left:     Upper extremity 5-/5 deltoid       5/5 deltoid 5-/5 biceps      5/5 biceps  5-/5 triceps      5/5 triceps 5-/5 hand grip      5/5 hand grip  Lower extremity     Lower extremity 5/5 hip flexor      5/5 hip flexor 5/5 quadricep      5/5 quadriceps  5/5 hamstrings     5/5 hamstrings 5/5 plantar flexion       5/5 plantar flexion 5/5 plantar extension     5/5 plantar extension Tone and bulk:normal tone throughout; no atrophy noted Sensory: decreased LT on the right side Deep Tendon Reflexes: 2+ and symmetric throughout Plantars: Right: downgoing   Left: downgoing Cerebellar: Unable to test (not following commands) Gait: deferred  Laboratory Studies:   Basic  Metabolic Panel: No results for input(s): NA, K, CL, CO2, GLUCOSE, BUN, CREATININE, CALCIUM, MG, PHOS in the last 168 hours.  Liver Function Tests: No results for input(s): AST, ALT, ALKPHOS, BILITOT, PROT, ALBUMIN in the last 168 hours. No results for input(s): LIPASE, AMYLASE in the last 168 hours. No results for input(s): AMMONIA in the last 168 hours.  CBC: No results for input(s): WBC, NEUTROABS, HGB, HCT, MCV, PLT  in the last 168 hours.  Cardiac Enzymes: No results for input(s): CKTOTAL, CKMB, CKMBINDEX, TROPONINI in the last 168 hours.  BNP: Invalid input(s): POCBNP  CBG: No results for input(s): GLUCAP in the last 168 hours.  Microbiology: No results found for this or any previous visit.  Coagulation Studies: No results for input(s): LABPROT, INR in the last 72 hours.  Urinalysis: No results for input(s): COLORURINE, LABSPEC, PHURINE, GLUCOSEU, HGBUR, BILIRUBINUR, KETONESUR, PROTEINUR, UROBILINOGEN, NITRITE, LEUKOCYTESUR in the last 168 hours.  Invalid input(s): APPERANCEUR  Lipid Panel:  No results found for: CHOL, TRIG, HDL, CHOLHDL, VLDL, LDLCALC  HgbA1C: No results found for: HGBA1C  Urine Drug Screen:  No results found for: LABOPIA, COCAINSCRNUR, LABBENZ, AMPHETMU, THCU, LABBARB  Alcohol Level: No results for input(s): ETH in the last 168 hours.  Other results:  Imaging: No results found.  Assessment: 39 y.o. male hx of migraines, hypertension, anxiety presenting with 1.5 week history of severe headache now with new onset of sensory, motor and speech deficits. Unclear etiology. Differential includes CVA vs complex migraine vs Dural Sinus thrombosis. Onset of neurological deficits after addition of relpax raises question of possible reversible vasoconstriction syndrome. A febrile with no leukocytosis makes infectious etiology less likely.   -Will order stat MRI with and without, MRA head and MRV brain.  -Agree with benadryl and compazine. Will order magnesium sulfate 1gm IV x 1 -Would hold on DHE or triptan therapy pending clarification of etiology of symptoms -Further workup and treatment pending imaging results   Addendum: Reviewed imaging results with neuroradiologist, Dr Dorena Dew. No signs of acute infarct or dural sinus thrombosis. 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 Concern  for possible reversible cerebral vasoconstriction syndrome. Discussed with neuro-IR, Dr Corliss Skains. Will proceed with diagnostic angiogram, consider further intervention based on imaging results. Will admit to neuro-ICU and monitor closely.    This patient is critically ill and at significant risk of neurological worsening, death and care requires constant monitoring of vital signs, hemodynamics,respiratory and cardiac monitoring,review of multiple databases, neurological assessment, discussion with family, other specialists and medical decision making of high complexity. I spent 45 minutes of neurocritical care time in the care of this patient.    Elspeth Cho, DO [facility] [phone]  If 7pm- 7am, please page neurology on call as listed in AMION. 10/01/2014, 12:22 PM

## 2014-10-01 NOTE — ED Notes (Signed)
Dr. Floyd at bedside. 

## 2014-10-02 ENCOUNTER — Inpatient Hospital Stay (HOSPITAL_COMMUNITY): Payer: Self-pay

## 2014-10-02 ENCOUNTER — Encounter (HOSPITAL_COMMUNITY): Payer: Self-pay | Admitting: *Deleted

## 2014-10-02 DIAGNOSIS — J95821 Acute postprocedural respiratory failure: Secondary | ICD-10-CM

## 2014-10-02 DIAGNOSIS — R4701 Aphasia: Secondary | ICD-10-CM | POA: Insufficient documentation

## 2014-10-02 DIAGNOSIS — I67841 Reversible cerebrovascular vasoconstriction syndrome: Secondary | ICD-10-CM

## 2014-10-02 LAB — RAPID URINE DRUG SCREEN, HOSP PERFORMED
Amphetamines: NOT DETECTED
BENZODIAZEPINES: POSITIVE — AB
Barbiturates: NOT DETECTED
Cocaine: NOT DETECTED
OPIATES: NOT DETECTED
TETRAHYDROCANNABINOL: NOT DETECTED

## 2014-10-02 LAB — URINALYSIS, ROUTINE W REFLEX MICROSCOPIC
Bilirubin Urine: NEGATIVE
Glucose, UA: NEGATIVE mg/dL
HGB URINE DIPSTICK: NEGATIVE
Ketones, ur: NEGATIVE mg/dL
LEUKOCYTES UA: NEGATIVE
NITRITE: NEGATIVE
Protein, ur: NEGATIVE mg/dL
Specific Gravity, Urine: 1.044 — ABNORMAL HIGH (ref 1.005–1.030)
Urobilinogen, UA: 0.2 mg/dL (ref 0.0–1.0)
pH: 5.5 (ref 5.0–8.0)

## 2014-10-02 LAB — BASIC METABOLIC PANEL
ANION GAP: 9 (ref 5–15)
BUN: 10 mg/dL (ref 6–20)
CO2: 24 mmol/L (ref 22–32)
CREATININE: 1.06 mg/dL (ref 0.61–1.24)
Calcium: 8 mg/dL — ABNORMAL LOW (ref 8.9–10.3)
Chloride: 105 mmol/L (ref 101–111)
GFR calc non Af Amer: >60 mL/min (ref >60)
Glucose, Bld: 103 mg/dL — ABNORMAL HIGH (ref 65–99)
POTASSIUM: 3.5 mmol/L (ref 3.5–5.1)
Sodium: 138 mmol/L (ref 135–145)

## 2014-10-02 LAB — CBC WITH DIFFERENTIAL/PLATELET
BASOS ABS: 0 10*3/uL (ref 0.0–0.1)
Basophils Relative: 0 % (ref 0–1)
Eosinophils Absolute: 0 10*3/uL (ref 0.0–0.7)
Eosinophils Relative: 0 % (ref 0–5)
HEMATOCRIT: 39.9 % (ref 39.0–52.0)
HEMOGLOBIN: 13.6 g/dL (ref 13.0–17.0)
Lymphocytes Relative: 22 % (ref 12–46)
Lymphs Abs: 2 10*3/uL (ref 0.7–4.0)
MCH: 34.7 pg — ABNORMAL HIGH (ref 26.0–34.0)
MCHC: 34.1 g/dL (ref 30.0–36.0)
MCV: 101.8 fL — ABNORMAL HIGH (ref 78.0–100.0)
Monocytes Absolute: 1.5 10*3/uL — ABNORMAL HIGH (ref 0.1–1.0)
Monocytes Relative: 17 % — ABNORMAL HIGH (ref 3–12)
NEUTROS ABS: 5.4 10*3/uL (ref 1.7–7.7)
NEUTROS PCT: 60 % (ref 43–77)
PLATELETS: 182 10*3/uL (ref 150–400)
RBC: 3.92 MIL/uL — AB (ref 4.22–5.81)
RDW: 13.7 % (ref 11.5–15.5)
WBC: 8.9 10*3/uL (ref 4.0–10.5)

## 2014-10-02 LAB — LIPID PANEL
Cholesterol: 181 mg/dL (ref 0–200)
HDL: 27 mg/dL — ABNORMAL LOW (ref >40)
LDL Cholesterol: 77 mg/dL (ref 0–99)
Total CHOL/HDL Ratio: 6.7 RATIO
Triglycerides: 383 mg/dL — ABNORMAL HIGH (ref <150)
VLDL: 77 mg/dL — ABNORMAL HIGH (ref 0–40)

## 2014-10-02 LAB — TRIGLYCERIDES: TRIGLYCERIDES: 383 mg/dL — AB (ref <150)

## 2014-10-02 MED ORDER — LABETALOL HCL 5 MG/ML IV SOLN
INTRAVENOUS | Status: AC
  Filled 2014-10-02: qty 4

## 2014-10-02 MED ORDER — SODIUM CHLORIDE 0.9 % IV SOLN
25.0000 ug/h | INTRAVENOUS | Status: DC
  Administered 2014-10-02: 50 ug/h via INTRAVENOUS
  Filled 2014-10-02: qty 50

## 2014-10-02 MED ORDER — MIDAZOLAM HCL 2 MG/2ML IJ SOLN
2.0000 mg | INTRAMUSCULAR | Status: DC | PRN
  Administered 2014-10-02: 2 mg via INTRAVENOUS

## 2014-10-02 MED ORDER — LABETALOL HCL 5 MG/ML IV SOLN
20.0000 mg | INTRAVENOUS | Status: DC | PRN
  Administered 2014-10-02 – 2014-10-03 (×3): 20 mg via INTRAVENOUS

## 2014-10-02 MED ORDER — VERAPAMIL HCL ER 120 MG PO TBCR
120.0000 mg | EXTENDED_RELEASE_TABLET | Freq: Every day | ORAL | Status: DC
  Administered 2014-10-02 – 2014-10-04 (×3): 120 mg via ORAL
  Filled 2014-10-02 (×3): qty 1

## 2014-10-02 MED ORDER — MIDAZOLAM HCL 2 MG/2ML IJ SOLN
2.0000 mg | INTRAMUSCULAR | Status: DC | PRN
  Filled 2014-10-02: qty 2

## 2014-10-02 MED ORDER — LABETALOL HCL 5 MG/ML IV SOLN
40.0000 mg | INTRAVENOUS | Status: DC
  Filled 2014-10-02: qty 8

## 2014-10-02 MED ORDER — FENTANYL BOLUS VIA INFUSION
50.0000 ug | INTRAVENOUS | Status: DC | PRN
  Filled 2014-10-02: qty 50

## 2014-10-02 MED ORDER — NIMODIPINE 60 MG/20ML PO SOLN
60.0000 mg | ORAL | Status: DC
  Filled 2014-10-02 (×7): qty 20

## 2014-10-02 MED ORDER — FENTANYL CITRATE (PF) 100 MCG/2ML IJ SOLN: 25.0000 ug | INTRAMUSCULAR | Status: DC | PRN

## 2014-10-02 MED ORDER — PANTOPRAZOLE SODIUM 40 MG PO TBEC
40.0000 mg | DELAYED_RELEASE_TABLET | Freq: Every day | ORAL | Status: DC
  Administered 2014-10-03 – 2014-10-04 (×2): 40 mg via ORAL
  Filled 2014-10-02 (×2): qty 1

## 2014-10-02 MED ORDER — LABETALOL HCL 5 MG/ML IV SOLN: 40.0000 mg | Freq: Once | INTRAVENOUS | Status: DC

## 2014-10-02 NOTE — Progress Notes (Signed)
PULMONARY / CRITICAL CARE MEDICINE   Name: Reginald Gray MRN: [id number] DOB: [birth year]    ADMISSION DATE:  10/01/2014 CONSULTATION DATE:  10/01/2014  REFERRING MD :  Dr. Hosie Poisson  CHIEF COMPLAINT: Head and neck pain.  INITIAL PRESENTATION:  39 yo male with 1.5 weeks of generalized, pounding headache associated with nausea, photophobia, phonophobia, paresthesias, difficulty with speech, confusion felt to be from reversible cerebral vasoconstriction syndrome.  Had cerebral angiogram with verapamil to Lt ICA.  Remained on vent post-procedure.  STUDIES:  8/05 CT head >> normal brain 8/05 MRA/MRV brain >> mod/severe narrowing distal Lt PCA, distal Lt MCA, Lt superior cerebellar arteries  SIGNIFICANT EVENTS: 8/05 admit, cerebral angiogram  SUBJECTIVE:  Awake, alert on WUA.  Wants ETT out.  Good mechanics on SBT.   VITAL SIGNS: Temp:  [97.3 F (36.3 C)-99.6 F (37.6 C)] 99.6 F (37.6 C) (08/06 0340) Pulse Rate:  [52-94] 85 (08/06 0816) Resp:  [14-30] 21 (08/06 0816) BP: (79-185)/(35-90) 113/61 mmHg (08/06 0700) SpO2:  [95 %-100 %] 100 % (08/06 0816) Arterial Line BP: (107-152)/(47-74) 113/50 mmHg (08/06 0800) FiO2 (%):  [40 %-100 %] 40 % (08/06 0816) Weight:  [301 lb 13 oz (136.9 kg)] 301 lb 13 oz (136.9 kg) (08/05 2100) VENTILATOR SETTINGS: Vent Mode:  [-] PRVC FiO2 (%):  [40 %-100 %] 40 % Set Rate:  [14 bmp] 14 bmp Vt Set:  [119 mL] 620 mL PEEP:  [5 cmH20] 5 cmH20 Plateau Pressure:  [17 cmH20-19 cmH20] 19 cmH20 INTAKE / OUTPUT:  Intake/Output Summary (Last 24 hours) at 10/02/14 0827 Last data filed at 10/02/14 0800  Gross per 24 hour  Intake 4002.7 ml  Output   1200 ml  Net 2802.7 ml    PHYSICAL EXAMINATION: General:  wdwn male, NAD  Neuro:  RASS 0 on WUA  Easily arousable, follows commands, appropriate, MAE  HEENT:  Mm moist, ETT, no JVD  Cardiovascular:  s1s2 rrr Lungs:  resps even non labored on PS 5/5, Vt >2L Abdomen:  Soft, +bs  Musculoskeletal:  Warm and  dry, no edema, sheath out  LABS:  CBC  Recent Labs Lab 10/01/14 1225 10/02/14 0145  WBC 10.1 8.9  HGB 16.8  17.3* 13.6  HCT 47.8  51.0 39.9  PLT 216 182   Coag's  Recent Labs Lab 10/01/14 1225  APTT 26  INR 1.06   BMET  Recent Labs Lab 10/01/14 1225 10/02/14 0145  NA 138  138 138  K 3.6  3.4* 3.5  CL 103  100* 105  CO2 22 24  BUN CREATININE 1.04  1.00 1.06  GLUCOSE 140*  144* 103*   Electrolytes  Recent Labs Lab 10/01/14 1225 10/02/14 0145  CALCIUM 9.6 8.0*   ABG  Recent Labs Lab 10/01/14 2153  PHART 7.319*  PCO2ART 46.5*  PO2ART 322*   Liver Enzymes  Recent Labs Lab 10/01/14 1225  AST 92*  ALT 151*  ALKPHOS 42  BILITOT 1.5*  ALBUMIN 4.3   Imaging Ct Head Wo Contrast  10/01/2014   CLINICAL DATA:  39 year old male code stroke. Headache for 1 week now with Broca's aphasia and right-sided weakness. Initial encounter.  EXAM: CT HEAD WITHOUT CONTRAST  TECHNIQUE: Contiguous axial images were obtained from the base of the skull through the vertex without intravenous contrast.  COMPARISON:  None.  FINDINGS: Visualized paranasal sinuses and mastoids are clear. No acute osseous abnormality identified. Visualized orbits and scalp soft tissues are within normal limits.  Cerebral  volume is normal. No intracranial mass effect or hemorrhage. Gray-white matter differentiation remains within normal limits throughout the left hemisphere. No acute cortically based infarct identified. No suspicious intracranial vascular hyperdensity. No intracranial mass identified.  IMPRESSION: Normal noncontrast CT appearance of the brain.  Study discussed by telephone with Neurology Dr. Hosie Poisson on 10/01/2014 at 1222 hours.   Electronically Signed   By: Odessa Fleming M.D.   On: 10/01/2014 12:24   Mr [name] Head Wo Contrast  10/01/2014   CLINICAL DATA:  Ten day history of generalized pounding headache refractory to multiple medications. Nausea and photophobia. Developed  numbness and paresthesias in the arms yesterday. Speech difficulty today. Expressive and receptive aphasia on neurologic exam. Slight right-sided weakness. Decreased light touch on the RIGHT side.  EXAM: MRI HEAD WITHOUT AND WITH CONTRAST AND MRA HEAD WITHOUT AND WITH CONTRAST AND MRV INTRACRANIAL  TECHNIQUE: Multiplanar, multiecho pulse sequences of the brain and surrounding structures were obtained without and with intravenous contrast. Angiographic images of the head were obtained using MRA technique without and with contrast. The intracranial venous structures were examined using vena graphic technique.  CONTRAST:  20mL MULTIHANCE GADOBENATE DIMEGLUMINE 529 MG/ML IV SOLN  COMPARISON:  CT head earlier today.  FINDINGS: MRI HEAD FINDINGS  No evidence for acute infarction, hemorrhage, mass lesion, hydrocephalus, or extra-axial fluid. Normal cerebral volume. No significant white matter disease. No large vessel or lacunar infarct.  Pituitary, pineal, and cerebellar tonsils unremarkable. No upper cervical lesions.  Flow voids are maintained throughout the carotid, basilar, and vertebral arteries. There are no areas of chronic hemorrhage.  Post infusion, no abnormal enhancement of the brain or meninges.  Visualized calvarium, skull base, and upper cervical osseous structures unremarkable. Scalp and extracranial soft tissues, orbits, sinuses, and mastoids show no acute process.  MRA HEAD FINDINGS  The internal carotid arteries are widely patent. The basilar artery is widely patent with vertebrals codominant.  Moderate to severe narrowing of the distal LEFT PCA and distal LEFT MCA branches. There may be slight irregularity of the proximal LEFT MCA M2 branches. No cerebellar branch occlusion but moderate to severe irregularity of the LEFT superior cerebellar artery. Findings concerning for reversible cerebral vasoconstriction syndrome (RCVS). No intracranial aneurysm.  MR VENOGRAM FINDINGS  The superior sagittal sinus is  widely patent. The RIGHT transverse and sigmoid sinus are dominant with hypoplasia of the LEFT transverse sinus. LEFT sigmoid sinus is hypoplastic but patent. Deep venous system is patent.  IMPRESSION: No acute stroke.  No subtle signs of subarachnoid blood.  Moderate to severe narrowing of the distal LEFT PCA and distal LEFT MCA branches as well as the LEFT superior cerebellar artery. Concern raised for reversible cerebral vasoconstriction syndrome.  Hypoplastic LEFT transverse sinus without signs of acute venous sinus thrombosis.   Electronically Signed   By: Elsie Stain M.D.   On: 10/01/2014 16:25   Mr Laqueta Jean WU Contrast  10/01/2014   CLINICAL DATA:  Ten day history of generalized pounding headache refractory to multiple medications. Nausea and photophobia. Developed numbness and paresthesias in the arms yesterday. Speech difficulty today. Expressive and receptive aphasia on neurologic exam. Slight right-sided weakness. Decreased light touch on the RIGHT side.  EXAM: MRI HEAD WITHOUT AND WITH CONTRAST AND MRA HEAD WITHOUT AND WITH CONTRAST AND MRV INTRACRANIAL  TECHNIQUE: Multiplanar, multiecho pulse sequences of the brain and surrounding structures were obtained without and with intravenous contrast. Angiographic images of the head were obtained using MRA technique without and with contrast. The intracranial  venous structures were examined using vena graphic technique.  CONTRAST:  20mL MULTIHANCE GADOBENATE DIMEGLUMINE 529 MG/ML IV SOLN  COMPARISON:  CT head earlier today.  FINDINGS: MRI HEAD FINDINGS  No evidence for acute infarction, hemorrhage, mass lesion, hydrocephalus, or extra-axial fluid. Normal cerebral volume. No significant white matter disease. No large vessel or lacunar infarct.  Pituitary, pineal, and cerebellar tonsils unremarkable. No upper cervical lesions.  Flow voids are maintained throughout the carotid, basilar, and vertebral arteries. There are no areas of chronic hemorrhage.  Post  infusion, no abnormal enhancement of the brain or meninges.  Visualized calvarium, skull base, and upper cervical osseous structures unremarkable. Scalp and extracranial soft tissues, orbits, sinuses, and mastoids show no acute process.  MRA HEAD FINDINGS  The internal carotid arteries are widely patent. The basilar artery is widely patent with vertebrals codominant.  Moderate to severe narrowing of the distal LEFT PCA and distal LEFT MCA branches. There may be slight irregularity of the proximal LEFT MCA M2 branches. No cerebellar branch occlusion but moderate to severe irregularity of the LEFT superior cerebellar artery. Findings concerning for reversible cerebral vasoconstriction syndrome (RCVS). No intracranial aneurysm.  MR VENOGRAM FINDINGS  The superior sagittal sinus is widely patent. The RIGHT transverse and sigmoid sinus are dominant with hypoplasia of the LEFT transverse sinus. LEFT sigmoid sinus is hypoplastic but patent. Deep venous system is patent.  IMPRESSION: No acute stroke.  No subtle signs of subarachnoid blood.  Moderate to severe narrowing of the distal LEFT PCA and distal LEFT MCA branches as well as the LEFT superior cerebellar artery. Concern raised for reversible cerebral vasoconstriction syndrome.  Hypoplastic LEFT transverse sinus without signs of acute venous sinus thrombosis.   Electronically Signed   By: Elsie Stain M.D.   On: 10/01/2014 16:25   Portable Chest Xray  10/01/2014   CLINICAL DATA:  Endotracheal tube placement post cerebral angiography.  EXAM: PORTABLE CHEST - 1 VIEW  COMPARISON:  12/24/2012.  FINDINGS: 2108 hours. Endotracheal tube tip is in the midtrachea. The heart size and mediastinal contours are stable. There are lower lung volumes with new retrocardiac pulmonary opacity, probably atelectasis, although potentially aspiration. The right lung is clear. There is no pleural effusion or pneumothorax.  IMPRESSION: Satisfactorily positioned endotracheal tube. Left lower  lobe atelectasis or aspiration.   Electronically Signed   By: Carey Bullocks M.D.   On: 10/01/2014 21:23   Mr Mrv Head Wo Cm  10/01/2014   CLINICAL DATA:  Ten day history of generalized pounding headache refractory to multiple medications. Nausea and photophobia. Developed numbness and paresthesias in the arms yesterday. Speech difficulty today. Expressive and receptive aphasia on neurologic exam. Slight right-sided weakness. Decreased light touch on the RIGHT side.  EXAM: MRI HEAD WITHOUT AND WITH CONTRAST AND MRA HEAD WITHOUT AND WITH CONTRAST AND MRV INTRACRANIAL  TECHNIQUE: Multiplanar, multiecho pulse sequences of the brain and surrounding structures were obtained without and with intravenous contrast. Angiographic images of the head were obtained using MRA technique without and with contrast. The intracranial venous structures were examined using vena graphic technique.  CONTRAST:  20mL MULTIHANCE GADOBENATE DIMEGLUMINE 529 MG/ML IV SOLN  COMPARISON:  CT head earlier today.  FINDINGS: MRI HEAD FINDINGS  No evidence for acute infarction, hemorrhage, mass lesion, hydrocephalus, or extra-axial fluid. Normal cerebral volume. No significant white matter disease. No large vessel or lacunar infarct.  Pituitary, pineal, and cerebellar tonsils unremarkable. No upper cervical lesions.  Flow voids are maintained throughout the carotid, basilar, and vertebral  arteries. There are no areas of chronic hemorrhage.  Post infusion, no abnormal enhancement of the brain or meninges.  Visualized calvarium, skull base, and upper cervical osseous structures unremarkable. Scalp and extracranial soft tissues, orbits, sinuses, and mastoids show no acute process.  MRA HEAD FINDINGS  The internal carotid arteries are widely patent. The basilar artery is widely patent with vertebrals codominant.  Moderate to severe narrowing of the distal LEFT PCA and distal LEFT MCA branches. There may be slight irregularity of the proximal LEFT MCA  M2 branches. No cerebellar branch occlusion but moderate to severe irregularity of the LEFT superior cerebellar artery. Findings concerning for reversible cerebral vasoconstriction syndrome (RCVS). No intracranial aneurysm.  MR VENOGRAM FINDINGS  The superior sagittal sinus is widely patent. The RIGHT transverse and sigmoid sinus are dominant with hypoplasia of the LEFT transverse sinus. LEFT sigmoid sinus is hypoplastic but patent. Deep venous system is patent.  IMPRESSION: No acute stroke.  No subtle signs of subarachnoid blood.  Moderate to severe narrowing of the distal LEFT PCA and distal LEFT MCA branches as well as the LEFT superior cerebellar artery. Concern raised for reversible cerebral vasoconstriction syndrome.  Hypoplastic LEFT transverse sinus without signs of acute venous sinus thrombosis.   Electronically Signed   By: Elsie Stain M.D.   On: 10/01/2014 16:25     ASSESSMENT / PLAN:  PULMONARY ETT 8/05 >>8/6 A: Compromised airway. Hx of OSA. P:   Extubate - ok with neuro  qhs CPAP  Pulmonary hygiene   CARDIOVASCULAR Lt radial a line 8/05 >>8/6 A:  Hx of HTN. P:  Monitor hemodynamics Hold outpt lotensin, coreg, lasix for now D/c cardene from [facility] per neuro   RENAL A:   Hypokalemia. P:   F/u and replace electrolytes as needed  GASTROINTESTINAL A:   Nutrition. P:   RN bedside swallow eval  Advance diet after extubation  HEMATOLOGIC A:   No acute issues. P:  F/u CBC   INFECTIOUS A:   No evidence for infection. P:   Monitor clinically  ENDOCRINE A:   Hx of hypogonadism. 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 Hx of migraines, anxiety. P:   Per neurology, neuro-IR F/u MRA 8/6 Hold outpt buspar    Dirk Dress, NP 10/02/2014  8:27 AM Pager: [phone] or [phone]

## 2014-10-02 NOTE — Evaluation (Signed)
Clinical/Bedside Swallow Evaluation Patient Details  Name: Reginald Gray MRN: [id number] Date of Birth: 10/30/1975  Today's Date: 10/02/2014 Time: SLP Start Time (ACUTE ONLY): 1014 SLP Stop Time (ACUTE ONLY): 1036 SLP Time Calculation (min) (ACUTE ONLY): 22 min  Past Medical History:  Past Medical History  Diagnosis Date  . Migraines   . Hypertension   . Low testosterone   . Vitamin D deficiency disease   . Anxiety   . Elevated LFTs   . Obesity   . Hypogonadism, male   . OSA on CPAP    Past Surgical History: History reviewed. No pertinent past surgical history. HPI:  Reginald Gray is an 38 y.o. male with a history of migraines, hypertension, anxiety presenting with 1.5 week history of severe generalized pounding headache refractory to multiple medications. Has associated nausea, photo and phonophobia. Denies any visual changes. Developed numbness and paresthesias in bilateral arms R>L. MRI demonstrated no CVA but possible reversible cerebral vasospasm of the L superior cerebellar artery. He was taken to IR where he received intra-arterial verapamil to b/l ICA with marked improvement of hemodynamic flow. Pt also experienced speech and language deficits and confusion.  Intubated for a procedure on 10-01-14, extubated this morning.    Assessment / Plan / Recommendation Clinical Impression  Pts swallow appearing functional with consistencies observed including ice chips, thin liquids, puree, and solids. Delayed throat clearing noted consistently following all PO trials likely secondary to recent procedural intubation. Pts appears to be protecting airway adequately with swallow initiation noted to be timely and no change in vital signs. Recommend implement regular thin liquid diet. Medicines whole with thin liquid by cup sips. ST follow up warranted for diet tolerance.       Aspiration Risk  Mild    Diet Recommendation Age appropriate regular solids;Thin   Medication Administration: Whole  meds with liquid Compensations: Minimize environmental distractions;Slow rate;Small sips/bites    Other  Recommendations Oral Care Recommendations: Oral care BID   Follow Up Recommendations       Frequency and Duration min 1 x/week  1 week   Pertinent Vitals/Pain     SLP Swallow Goals     Swallow Study Prior Functional Status       General Date of Onset: 10/01/14 Other Pertinent Information: Reginald Gray is an 39 y.o. male with a history of migraines, hypertension, anxiety presenting with 1.5 week history of severe generalized pounding headache refractory to multiple medications. Has associated nausea, photo and phonophobia. Denies any visual changes. Developed numbness and paresthesias in bilateral arms R>L. MRI demonstrated no CVA but possible reversible cerebral vasospasm of the L superior cerebellar artery. He was taken to IR where he received intra-arterial verapamil to b/l ICA with marked improvement of hemodynamic flow. Pt also experienced speech and language deficits and confusion.  Intubated for a procedure on 10-01-14, extubated this morning.  Type of Study: Bedside swallow evaluation Diet Prior to this Study: NPO Temperature Spikes Noted: No Respiratory Status: Supplemental O2 delivered via (comment) History of Recent Intubation: Yes Length of Intubations (days): 1 days Date extubated: 10/02/14 Behavior/Cognition: Alert;Cooperative;Pleasant mood Oral Cavity - Dentition: Adequate natural dentition/normal for age Self-Feeding Abilities: Able to feed self Patient Positioning: Upright in bed Baseline Vocal Quality: Low vocal intensity Volitional Cough: Strong Volitional Swallow: Able to elicit    Oral/Motor/Sensory Function Overall Oral Motor/Sensory Function: Appears within functional limits for tasks assessed Labial ROM: Within Functional Limits Labial Symmetry: Within Functional Limits Labial Strength: Within Functional Limits Lingual ROM:  Within Functional  Limits Lingual Strength: Within Functional Limits Facial ROM: Within Functional Limits   Ice Chips Ice chips: Within functional limits Other Comments: throat clear   Thin Liquid Thin Liquid: Within functional limits Other Comments: throat clear    Nectar Thick Nectar Thick Liquid: Not tested   Honey Thick Honey Thick Liquid: Not tested   Puree Puree: Within functional limits Other Comments: throat clear    Solid   GO    Solid: Within functional limits Other Comments: throat clear       Marcene Duos MA, CCC-SLP Acute Care Speech Language Pathologist    Marcene Duos E 10/02/2014,10:44 AM

## 2014-10-02 NOTE — Progress Notes (Signed)
STROKE TEAM PROGRESS NOTE   HISTORY Reginald Gray is a 39 y.o. male with migraines, hypertension, and anxiety presenting with 1.5 week history of severe generalized pounding headache refractory to multiple medications. Has associated nausea, photo and phonophobia. Denies any visual changes. Yesterday developed numbness and paresthesias in bilateral arms R>L. This continued today. Around 0930 today his mom noted that he was having speech difficulties. Described as difficulty getting words out and confusion. EMS notified and code stroke activated.   Patient has been treated by his PCP for his headache. Has received IV toradol and Relpax x 2 with no symptomatic benefit. CT head imaging reviewed and overall unremarkable with no acute process. In the ED received benadryl 50mg  x 1 and compazine 10mg  x 1.   Date last known well: 08/05 Time last known well: unclear tPA Given: no, outside tPA window Modified Rankin: Rankin Score=0   SUBJECTIVE (INTERVAL HISTORY) His RN is at the bedside.  Overall he feels his condition is rapidly improving. No significant headache but BP slightly elevated   OBJECTIVE Temp:  [97.3 F (36.3 C)-99.6 F (37.6 C)] 99.6 F (37.6 C) (08/06 0340) Pulse Rate:  [52-94] 69 (08/06 0700) Cardiac Rhythm:  [-] Normal sinus rhythm (08/05 2100) Resp:  [14-30] 14 (08/06 0700) BP: (79-185)/(35-90) 113/61 mmHg (08/06 0700) SpO2:  [95 %-100 %] 99 % (08/06 0700) Arterial Line BP: (107-152)/(47-74) 111/49 mmHg (08/06 0700) FiO2 (%):  [40 %-100 %] 40 % (08/06 0339) Weight:  [136.9 kg (301 lb 13 oz)] 136.9 kg (301 lb 13 oz) (08/05 2100)  No results for input(s): GLUCAP in the last 168 hours.  Recent Labs Lab 10/01/14 1225 10/02/14 0145  NA 138  138 138  K 3.6  3.4* 3.5  CL 103  100* 105  CO2 22 24  GLUCOSE 140*  144* 103*  BUN 11  11 10   CREATININE 1.04  1.00 1.06  CALCIUM 9.6 8.0*    Recent Labs Lab 10/01/14 1225  AST 92*  ALT 151*  ALKPHOS 42  BILITOT  1.5*  PROT 7.6  ALBUMIN 4.3    Recent Labs Lab 10/01/14 1225 10/02/14 0145  WBC 10.1 8.9  NEUTROABS 6.0 5.4  HGB 16.8  17.3* 13.6  HCT 47.8  51.0 39.9  MCV 100.2* 101.8*  PLT 216 182   No results for input(s): CKTOTAL, CKMB, CKMBINDEX, TROPONINI in the last 168 hours.  Recent Labs  10/01/14 1225  LABPROT 14.0  INR 1.06    Recent Labs  10/02/14 0239  COLORURINE YELLOW  LABSPEC 1.044*  PHURINE 5.5  GLUCOSEU NEGATIVE  HGBUR NEGATIVE  BILIRUBINUR NEGATIVE  KETONESUR NEGATIVE  PROTEINUR NEGATIVE  UROBILINOGEN 0.2  NITRITE NEGATIVE  LEUKOCYTESUR NEGATIVE       Component Value Date/Time   CHOL 181 10/02/2014 0145   TRIG 383* 10/02/2014 0145   TRIG 383* 10/02/2014 0145   HDL 27* 10/02/2014 0145   CHOLHDL 6.7 10/02/2014 0145   VLDL 77* 10/02/2014 0145   LDLCALC 77 10/02/2014 0145   No results found for: HGBA1C    Component Value Date/Time   LABOPIA NONE DETECTED 10/02/2014 0239   COCAINSCRNUR NONE DETECTED 10/02/2014 0239   LABBENZ POSITIVE* 10/02/2014 0239   AMPHETMU NONE DETECTED 10/02/2014 0239   THCU NONE DETECTED 10/02/2014 0239   LABBARB NONE DETECTED 10/02/2014 0239     Recent Labs Lab 10/01/14 1237  ETH <5   Imaging    Ct Head Wo Contrast 10/01/2014    Normal noncontrast CT appearance of  the brain.     Mr [name] Head Wo Contrast 10/01/2014    No acute stroke.  No subtle signs of subarachnoid blood.   Moderate to severe narrowing of the distal LEFT PCA and distal LEFT MCA branches as well as the LEFT superior cerebellar artery.  Concern raised for reversible cerebral vasoconstriction syndrome.   Hypoplastic LEFT transverse sinus without signs of acute venous sinus thrombosis.      Portable Chest Xray 10/01/2014    Satisfactorily positioned endotracheal tube. Left lower lobe atelectasis or aspiration.       Mr Mrv Head Wo Cm 10/01/2014    No acute stroke.  No subtle signs of subarachnoid blood.   Moderate to severe narrowing of the  distal LEFT PCA and distal LEFT MCA branches as well as the LEFT superior cerebellar artery.  Concern raised for reversible cerebral vasoconstriction syndrome.   Hypoplastic LEFT transverse sinus without signs of acute venous sinus thrombosis.        PHYSICAL EXAM Obese middle aged Caucasian male not in distress. . Afebrile. Head is nontraumatic. Neck is supple without bruit.    Cardiac exam no murmur or gallop. Lungs are clear to auscultation. Distal pulses are well felt. Neurological Exam ;  Awake  Alert oriented x 3. Normal  But a bit slow hesitant speech and language.eye movements full without nystagmus.fundi were not visualized. Vision acuity and fields appear normal. Hearing is normal. Palatal movements are normal. Face symmetric. Tongue midline. Normal strength, tone, reflexes and coordination. Normal sensation. Gait deferred.   ASSESSMENT/PLAN Mr. Reginald Gray is a 39 y.o. male with history of migraines, anxiety, OSA, obesity, hypertension, and anxiety presenting with headache, nausea, photophobia, phonophobia, speech difficulties, confusion, and numbness with paresthesias of both arms.  He did not receive IV t-PA due to late presentation.  Complicated migraine episode with reversible vasospasm treated with IA verapamil. No stroke seen on MRI  Resultant  No deficits  MRI  No acute stroke.   MRA  Concern raised for reversible cerebral vasoconstriction syndrome.  Hypoplastic LEFT transverse sinus without  signs of acute venous sinus thrombosis.    Carotid Doppler  not ordered  2D Echo  not ordered  LDL 77  HgbA1c pending  Lovenox for VTE prophylaxis  Diet NPO time specified  no antithrombotic prior to admission, now on no antithrombotic  Patient counseled to be compliant with his antithrombotic medications  Ongoing aggressive stroke risk factor management  Therapy recommendations:  Pending  Disposition: Pending  Hypertension  Home meds: Lotensin and  Coreg,  Blood pressure tends to run low on nimodipine and verapamil   Hyperlipidemia  Home meds: No lipid lowering medications prior to admission  LDL 77, goal < 70    Other Active Problems  Mildly elevated liver function tests  Hypotension  Other Pertinent History    Hospital day # 1  I have personally examined this patient, reviewed notes, independently viewed imaging studies, participated in medical decision making and plan of care. I have made any additions or clarifications directly to the above note. He has presented with..refractory migraine and focal neurological symptoms with vasospasm on catheter angio from reversible cerebral vasoconstriction syndrome likely triggered by relpax in setting of status migrainosus. He remains at risk for neurological worsening, recurrent stroke/TIAs and needs  Tight BP control and close neurologic monitoring. Start verapamil for migraine prophylaxis and Dc  Nimodipine as f/u MRA today shows no vasospasm. Keep SBP below 130. Use iv labetalol. Transfer to floor later  today.  Delia Heady, MD Medical Director [facility] Pager: [phone] 10/02/2014 4:08 PM      To contact Stroke Continuity provider, please refer to [email or web address]. After hours, contact General Neurology

## 2014-10-02 NOTE — Progress Notes (Signed)
Utilization Review completed. Keaisha Sublette RN BSN CM 

## 2014-10-02 NOTE — Evaluation (Signed)
Speech Language Pathology Evaluation Patient Details Name: Reginald Gray MRN: [id number] DOB: 1976-01-10 Today's Date: 10/02/2014 Time: 4098-1191 SLP Time Calculation (min) (ACUTE ONLY): 20 min  Problem List:  Patient Active Problem List   Diagnosis Date Noted  . 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 Migraines   . Hypertension   . Low testosterone   . Vitamin D deficiency disease   . Anxiety   . Elevated LFTs   . Obesity   . Hypogonadism, male   . OSA on CPAP    Past Surgical History: History reviewed. No pertinent past surgical history. HPI:  Reginald Gray is an 39 y.o. male with a history of migraines, hypertension, anxiety presenting with 1.5 week history of severe generalized pounding headache refractory to multiple medications. Has associated nausea, photo and phonophobia. Denies any visual changes. Developed numbness and paresthesias in bilateral arms R>L. MRI demonstrated no CVA but possible reversible cerebral vasospasm of the L superior cerebellar artery. He was taken to IR where he received intra-arterial verapamil to b/l ICA with marked improvement of hemodynamic flow. Pt also experienced speech and language deficits and confusion.  Intubated for a procedure on 10-01-14, extubated this morning.    Assessment / Plan / Recommendation Clinical Impression  Pt is exhibiting mild to moderate cognitive impairments which family states are different from baseline. Cognitive screener, MOCA, administered: 14/30. Deficits characterized by decreased recall of novel information, decreased thought organization and speed of processing, decreased mental manipulation, and decreased executive function skills. Expressive, receptive, and graphic expression skills appear intact. Although pt with limited generative naming during assessment.  Pt states he lives with family and his PLOF was independent and is  employed currently as a Teacher, adult education. Unclear if decreased motivation for cognitive testing exaccerbated noted cognitive deficits. Recommend continued ST intervention for appropriate cognitive strategies and to see if cognitive changes clear. ST to follow up.     SLP Assessment  Patient needs continued Speech Lanaguage Pathology Services    Follow Up Recommendations       Frequency and Duration min 2x/week  2 weeks   Pertinent Vitals/Pain     SLP Goals  Potential to Achieve Goals (ACUTE ONLY): Good  SLP Evaluation Prior Functioning  Cognitive/Linguistic Baseline: Within functional limits  Lives With: Family Available Help at Discharge: Family Education: some college  Vocation: Full time employment   Cognition  Overall Cognitive Status: Impaired/Different from baseline Arousal/Alertness: Awake/alert (with some lethargy ) Orientation Level: Oriented to place;Oriented to person;Disoriented to time Attention: Focused;Sustained Focused Attention: Impaired Focused Attention Impairment: Verbal complex;Functional complex Sustained Attention: Impaired Sustained Attention Impairment: Verbal complex;Functional complex Memory: Impaired Memory Impairment: Decreased recall of new information;Decreased short term memory Awareness: Impaired Executive Function: Self Correcting;Self Monitoring;Sequencing Sequencing: Impaired Sequencing Impairment: Verbal complex;Functional complex Self Monitoring: Impaired    Comprehension  Auditory Comprehension Overall Auditory Comprehension: Appears within functional limits for tasks assessed Yes/No Questions: Within Functional Limits Reading Comprehension Reading Status: Within funtional limits    Expression Verbal Expression Overall Verbal Expression: Appears within functional limits for tasks assessed Written Expression Dominant Hand: Right   Oral / Motor Oral Motor/Sensory Function Overall Oral Motor/Sensory Function: Appears within  functional limits for tasks assessed Labial ROM: Within Functional Limits Labial Symmetry: Within Functional Limits Labial Strength: Within Functional Limits Lingual ROM: Within Functional Limits Lingual Strength: Within Functional Limits Facial ROM: Within Functional Limits Motor Speech Overall Motor Speech: Appears within functional  limits for tasks assessed   GOMarcene Duos Sumney MA, CCC-SLP Acute Care Speech Language Pathologist    [name] 10/02/2014, 12:27 PM

## 2014-10-02 NOTE — Procedures (Signed)
Extubation Procedure Note  Patient Details:   Name: Reginald Gray DOB: Apr 30, 1975 MRN: [id number]   Airway Documentation:     Evaluation  O2 sats: stable throughout Complications: No apparent complications Patient did tolerate procedure well. Bilateral Breath Sounds: Diminished, Clear Suctioning: Airway Yes.   Extubated and placed on 2L.  Sats 99  Claramae Rigdon V 10/02/2014, 8:29 AM

## 2014-10-03 DIAGNOSIS — J95821 Acute postprocedural respiratory failure: Secondary | ICD-10-CM

## 2014-10-03 LAB — BASIC METABOLIC PANEL
Anion gap: 11 (ref 5–15)
BUN: 5 mg/dL — AB (ref 6–20)
CO2: 23 mmol/L (ref 22–32)
Calcium: 8.4 mg/dL — ABNORMAL LOW (ref 8.9–10.3)
Chloride: 103 mmol/L (ref 101–111)
Creatinine, Ser: 0.81 mg/dL (ref 0.61–1.24)
GFR calc Af Amer: >60 mL/min (ref >60)
GFR calc non Af Amer: >60 mL/min (ref >60)
Glucose, Bld: 119 mg/dL — ABNORMAL HIGH (ref 65–99)
POTASSIUM: 3.5 mmol/L (ref 3.5–5.1)
Sodium: 137 mmol/L (ref 135–145)

## 2014-10-03 MED ORDER — FUROSEMIDE 20 MG PO TABS
20.0000 mg | ORAL_TABLET | Freq: Every day | ORAL | Status: DC
  Administered 2014-10-03 – 2014-10-04 (×2): 20 mg via ORAL
  Filled 2014-10-03 (×2): qty 1

## 2014-10-03 MED ORDER — BUSPIRONE HCL 10 MG PO TABS: 5.0000 mg | ORAL_TABLET | Freq: Two times a day (BID) | ORAL | Status: DC | PRN

## 2014-10-03 MED ORDER — DICLOFENAC SODIUM 1 % TD GEL
4.0000 g | Freq: Four times a day (QID) | TRANSDERMAL | Status: DC
  Administered 2014-10-03 (×3): 4 g via TOPICAL
  Filled 2014-10-03: qty 100

## 2014-10-03 MED ORDER — CARVEDILOL 12.5 MG PO TABS
12.5000 mg | ORAL_TABLET | Freq: Two times a day (BID) | ORAL | Status: DC
  Administered 2014-10-03 – 2014-10-04 (×4): 12.5 mg via ORAL
  Filled 2014-10-03 (×6): qty 1

## 2014-10-03 MED ORDER — TESTOSTERONE 50 MG/5GM (1%) TD GEL
5.0000 g | Freq: Every day | TRANSDERMAL | Status: DC
  Administered 2014-10-03 – 2014-10-04 (×2): 5 g via TRANSDERMAL
  Filled 2014-10-03 (×2): qty 5

## 2014-10-03 MED ORDER — BENAZEPRIL HCL 20 MG PO TABS
40.0000 mg | ORAL_TABLET | Freq: Every day | ORAL | Status: DC
  Administered 2014-10-03 – 2014-10-04 (×2): 40 mg via ORAL
  Filled 2014-10-03: qty 1
  Filled 2014-10-03: qty 2

## 2014-10-03 MED ORDER — VITAMIN D (ERGOCALCIFEROL) 1.25 MG (50000 UNIT) PO CAPS
50000.0000 [IU] | ORAL_CAPSULE | ORAL | Status: DC
  Administered 2014-10-03: 50000 [IU] via ORAL
  Filled 2014-10-03: qty 1

## 2014-10-03 NOTE — Progress Notes (Signed)
Pt. Already wearing cpap. Pt. Places on himself.

## 2014-10-03 NOTE — Progress Notes (Signed)
Patient is transferred from ICU. Patient alert and oriented x 4.  Patient oriented to room. Denies any need at this time. Will continue to monitor.

## 2014-10-03 NOTE — Evaluation (Signed)
Physical Therapy Evaluation Patient Details Name: Reginald Gray MRN: [id number] DOB: 1975/08/06 Today's Date: 10/03/2014   History of Present Illness    39 y.o. male with migraines, hypertension, and anxiety presenting with 1.5 week history of severe generalized pounding headache refractory to multiple medications. Has associated nausea, photo and phonophobia. Denies any visual changes. Yesterday developed numbness and paresthesias in bilateral arms R>L. This continued today. Around 0930 today his mom noted that he was having speech difficulties. Described as difficulty getting words out and confusion. EMS notified and code stroke activated.  CT head imaging reviewed and overall unremarkable with no acute process.     Clinical Impression  Pt presents at/near baseline for functional mobility, noting mild unsteadiness early and likely due to first time out of bed.  Able to perform stairs, walk >400 feet and balance independently.  Pt most concerned about persistent word-finding difficulties and may or may not endorse diplopia (though does not demonstrate limitations related to, and does report nearsightedness, without corrective lenses in currently).  No PT needs.  Thanks,     Follow Up Recommendations No PT follow up    Equipment Recommendations  None recommended by PT    Recommendations for Other Services       Precautions / Restrictions        Mobility  Bed Mobility Overal bed mobility: Independent                Transfers Overall transfer level: Independent                  Ambulation/Gait Ambulation/Gait assistance: Supervision Ambulation Distance (Feet): 450 Feet Assistive device: None Gait Pattern/deviations: Step-through pattern;Staggering right     General Gait Details: x2 mild, self-corrected stumble to right early in walk, with no evidence after distance  Stairs Stairs: Yes Stairs assistance: Supervision Stair Management: One rail  Left;Forwards Number of Stairs: 13 General stair comments: no difficulties or hesitation  Wheelchair Mobility    Modified Rankin (Stroke Patients Only)       Balance Overall balance assessment: Independent (Rh EO/EC >30 seconds)                                           Pertinent Vitals/Pain Pain Assessment: 0-10 Pain Score: 5  Pain Location: headache Pain Intervention(s): Monitored during session    Home Living Family/patient expects to be discharged to:: Private residence Living Arrangements: Parent Available Help at Discharge: Family Type of Home: House Home Access: Level entry     Home Layout: Two level Home Equipment: None      Prior Function Level of Independence: Independent               Hand Dominance        Extremity/Trunk Assessment   Upper Extremity Assessment: Overall WFL for tasks assessed           Lower Extremity Assessment: Overall WFL for tasks assessed         Communication   Communication: Expressive difficulties (word-finding difficulties)  Cognition Arousal/Alertness: Awake/alert Behavior During Therapy: WFL for tasks assessed/performed Overall Cognitive Status: Within Functional Limits for tasks assessed                      General Comments General comments (skin integrity, edema, etc.): pt most concerned about persistent word finding difficulties, may have mild diplopia but  also needs corrective lenses    Exercises        Assessment/Plan    PT Assessment Patent does not need any further PT services  PT Diagnosis     PT Problem List    PT Treatment Interventions     PT Goals (Current goals can be found in the Care Plan section) Acute Rehab PT Goals Patient Stated Goal: talk PT Goal Formulation: All assessment and education complete, DC therapy    Frequency     Barriers to discharge        Co-evaluation               End of Session   Activity Tolerance: Patient  tolerated treatment well Patient left: in bed;with call bell/phone within reach;with family/visitor present Nurse Communication: Mobility status         Time: 1610-9604 PT Time Calculation (min) (ACUTE ONLY): 16 min   Charges:   PT Evaluation $Initial PT Evaluation Tier I: 1 Procedure     PT G CodesDennis Bast 10/03/2014, 1:49 PM

## 2014-10-03 NOTE — Progress Notes (Signed)
PULMONARY / CRITICAL CARE MEDICINE   Name: Reginald Gray MRN: [id number] DOB: April 21, 1975    ADMISSION DATE:  10/01/2014 CONSULTATION DATE:  10/01/2014  REFERRING MD :  Dr. Hosie Poisson  CHIEF COMPLAINT: Head and neck pain.  INITIAL PRESENTATION:  39 yo male with 1.5 weeks of generalized, pounding headache associated with nausea, photophobia, phonophobia, paresthesias, difficulty with speech, confusion felt to be from reversible cerebral vasoconstriction syndrome.  Had cerebral angiogram with verapamil to Lt ICA.  Remained on vent post-procedure.  STUDIES:  8/05 CT head >> normal brain 8/05 MRA/MRV brain >> mod/severe narrowing distal Lt PCA, distal Lt MCA, Lt superior cerebellar arteries   SIGNIFICANT EVENTS: 8/05 admit, cerebral angiogram  SUBJECTIVE:  Awake, alert, eating breakfast.  Extubated 8/6.  Denies SOB, chest pain.  Wore CPAP overnight   VITAL SIGNS: Temp:  [98.3 F (36.8 C)-99.1 F (37.3 C)] 98.3 F (36.8 C) (08/07 0755) Pulse Rate:  [63-105] 72 (08/07 0700) Resp:  [15-28] 19 (08/07 0700) BP: (82-184)/(41-97) 145/73 mmHg (08/07 0700) SpO2:  [90 %-99 %] 95 % (08/07 0700) Weight:  [305 lb 12.5 oz (138.7 kg)] 305 lb 12.5 oz (138.7 kg) (08/07 0500) VENTILATOR SETTINGS:   INTAKE / OUTPUT:  Intake/Output Summary (Last 24 hours) at 10/03/14 0845 Last data filed at 10/03/14 0700  Gross per 24 hour  Intake   1725 ml  Output   2425 ml  Net   -700 ml    PHYSICAL EXAMINATION: General:  wdwn male, NAD sitting on side of bed eating breakfast  Neuro:  Awake, alert, appropriate, oriented x3, MAE  HEENT:  Mm moist,  no JVD  Cardiovascular:  s1s2 rrr Lungs:  resps even non labored on RA, diminished bases otherwise clear  Abdomen:  Soft, +bs  Musculoskeletal:  Warm and dry, no edema  LABS:  CBC  Recent Labs Lab 10/01/14 1225 10/02/14 0145  WBC 10.1 8.9  HGB 16.8  17.3* 13.6  HCT 47.8  51.0 39.9  PLT 216 182   Coag's  Recent Labs Lab 10/01/14 1225  APTT 26   INR 1.06   BMET  Recent Labs Lab 10/01/14 1225 10/02/14 0145 10/03/14 0210  NA 138  138 138 137  K 3.6  3.4* 3.5 3.5  CL 103  100* 105 103  CO2 22 24 23   BUN 11  11 10  5*  CREATININE 1.04  1.00 1.06 0.81  GLUCOSE 140*  144* 103* 119*   Electrolytes  Recent Labs Lab 10/01/14 1225 10/02/14 0145 10/03/14 0210  CALCIUM 9.6 8.0* 8.4*   ABG  Recent Labs Lab 10/01/14 2153  PHART 7.319*  PCO2ART 46.5*  PO2ART 322*   Liver Enzymes  Recent Labs Lab 10/01/14 1225  AST 92*  ALT 151*  ALKPHOS 42  BILITOT 1.5*  ALBUMIN 4.3   Imaging Mr [name] Head Wo Contrast  10/02/2014   CLINICAL DATA:  RCVS.  Status post verapamil intra-arterial.  EXAM: MRA HEAD WITHOUT CONTRAST  TECHNIQUE: Angiographic images of the Circle of Willis were obtained using MRA technique without intravenous contrast.  COMPARISON:  Four-vessel angio 10/01/2014. MRI brain and MRA intracranial 10/01/2014.  FINDINGS: Compared with yesterday's MRA, there is exceedingly robust flow into the distal MCA and PCA branches bilaterally. This is more apparent on the LEFT, the side of greater involvement previously. Intracranial flow appears normal.  IMPRESSION: Normal intracranial MRA after intra-arterial verapamil.   Electronically Signed   By: Elsie Stain M.D.   On: 10/02/2014 09:49     ASSESSMENT /  PLAN:  PULMONARY ETT 8/05 >>8/6 A: Hx of OSA. P:   qhs CPAP  Pulmonary hygiene   CARDIOVASCULAR Lt radial a line 8/05 >>8/6 A:  Hx of HTN. P:  Monitor hemodynamics Resume outpt coreg  PO verapamil per neuro  Cont hold outpt  Hold outpt lotensin, lasix for now Nimotop per neuro   RENAL A:   Hypokalemia. P:   F/u and replace electrolytes as needed  GASTROINTESTINAL A:   Nutrition. P:   PO diet  NSL IV   HEMATOLOGIC A:   No acute issues. P:  F/u CBC   INFECTIOUS A:   No evidence for infection. P:   Monitor clinically  ENDOCRINE A:   Hx of hypogonadism. P:   Hold outpt  testosterone  NEUROLOGIC A:   Reversible cerebral vasoconstriction syndrome - much improved s/p verapamil to Lt ICA 8/05. Hx of migraines, anxiety. P:   Per neurology, neuro-IR Hold outpt buspar   For tx to floor  PCCM signing off, please call back if needed   Dirk Dress, NP 10/03/2014  8:45 AM Pager: [phone] or [phone]

## 2014-10-03 NOTE — Evaluation (Addendum)
Occupational Therapy Evaluation Patient Details Name: Reginald Gray MRN: [id number] DOB: [birth year] Today's Date: 10/03/2014    History of Present Illness 39 yo M with a chief complaint of headache. This been going on for at least 10 days. Patient saw his PCP 3 days ago was given 2 shots with some mild improvement. This morning patient suddenly had difficulty speaking was complaining of paresthesias generally just before that happened. Having severe photophobia and severe worsening of headache as well. Family states that he has had migraines in the past but they resolved in his 30s. Family denies head injury. History of hypertension denies history of diabetes hyperlipidemia. CT head imaging reviewed and overall unremarkable with no acute process.    Clinical Impression   Patient presenting with blurring of vision, decreased awareness > right side. Patient independent and no blurring of vision PTA. Patient is currently independent with mobility, but limited due to vision and expressive difficulties. Patient will benefit from acute OT to increase overall independence in the areas of vision and expressive difficulties for ADLs in order to safely discharge home with mother. \  Encouraged family to bring in glasses, since he normally wears his glasses daily.     Follow Up Recommendations  No OT follow up;Supervision - Intermittent    Equipment Recommendations  None recommended by OT    Recommendations for Other Services  None at this time   Precautions / Restrictions Precautions Precautions: None Restrictions Weight Bearing Restrictions: No    Mobility Bed Mobility Overal bed mobility: Independent  Transfers Overall transfer level: Independent   Balance Overall balance assessment: Independent;No apparent balance deficits (not formally assessed)    ADL Overall ADL's : At baseline;Modified independent General ADL Comments: Pt overall independent with ADLs and functional transfers,  including tub/shower transfer. Pt does however tend to run into things on his right side. See vision for more comments regarding this.      Vision Vision Assessment?: Vision impaired- to be further tested in functional context Additional Comments: Pt reports blurring of vision, but thinks this may be due to no glasses/contacts and headache. Pt kept running into things on his right side during functional ambulation in hallway. Pt's occular ROM is WNL, peripheral vision seems to be WNL(may want to reassess this).           Pertinent Vitals/Pain Pain Assessment: 0-10 Pain Score:  (unable to state due to expressive difficulties) Pain Location: head Pain Descriptors / Indicators: Aching Pain Intervention(s): Monitored during session     Hand Dominance Right   Extremity/Trunk Assessment Upper Extremity Assessment Upper Extremity Assessment: Generalized weakness (left weaker than right)   Lower Extremity Assessment Lower Extremity Assessment: Overall WFL for tasks assessed   Cervical / Trunk Assessment Cervical / Trunk Assessment: Normal   Communication Communication Communication: Expressive difficulties (word finding difficulties)   Cognition Arousal/Alertness: Awake/alert Behavior During Therapy: WFL for tasks assessed/performed Overall Cognitive Status: Within Functional Limits for tasks assessed (difficult to fully assess secondary to expressive difficulties)              Home Living Family/patient expects to be discharged to:: Private residence Living Arrangements: Parent Available Help at Discharge: Family Type of Home: House Home Access: Level entry (coming into back)     Home Layout: Two level;Bed/bath upstairs Alternate Level Stairs-Number of Steps: 13 Alternate Level Stairs-Rails: Left Bathroom Shower/Tub: Tub/shower unit;Curtain   Firefighter: Standard     Home Equipment: None      Lives With: Family  Prior Functioning/Environment Level of  Independence: Independent     OT Diagnosis: Generalized weakness;Disturbance of vision   OT Problem List: Impaired vision/perception;Other (comment) (expressive difficulties)   OT Treatment/Interventions: Visual/perceptual remediation/compensation;Other (comment) (expressing ADL needs)    OT Goals(Current goals can be found in the care plan section) Acute Rehab OT Goals Patient Stated Goal: talk OT Goal Formulation: With patient Time For Goal Achievement: 10/17/14 Potential to Achieve Goals: Good ADL Goals Additional ADL Goal #2: Patient will be able to independently verbalize needs for ADL 4/5 times   OT Frequency: Min 2X/week   Barriers to D/C: None known at this time   End of Session Activity Tolerance: Patient tolerated treatment well Patient left: in bed;with call bell/phone within reach;with family/visitor present   Time: 1445-1506 OT Time Calculation (min): 21 min Charges:  OT General Charges $OT Visit: 1 Procedure OT Evaluation $Initial OT Evaluation Tier I: 1 Procedure  Rubye Strohmeyer , MS, OTR/L, CLT Pager: [phone]  10/03/2014, 3:27 PM

## 2014-10-03 NOTE — Progress Notes (Signed)
STROKE TEAM PROGRESS NOTE   HISTORY Reginald Gray is a 39 y.o. male with migraines, hypertension, and anxiety presenting with 1.5 week history of severe generalized pounding headache refractory to multiple medications. Has associated nausea, photo and phonophobia. Denies any visual changes. Yesterday developed numbness and paresthesias in bilateral arms R>L. This continued today. Around 0930 today his mom noted that he was having speech difficulties. Described as difficulty getting words out and confusion. EMS notified and code stroke activated.   Patient has been treated by his PCP for his headache. Has received IV toradol and Relpax x 2 with no symptomatic benefit. CT head imaging reviewed and overall unremarkable with no acute process. In the ED received benadryl  x 1 and compazine  x 1.   Date last known well: 08/05 Time last known well: unclear tPA Given: no, outside tPA window Modified Rankin: Rankin Score=0   SUBJECTIVE (INTERVAL HISTORY) His RN is at the bedside.  Overall he feels his condition is rapidly improving. No significant headache but BP slightly elevated   OBJECTIVE Temp:  [98.3 F (36.8 C)-99.1 F (37.3 C)] 98.3 F (36.8 C) (08/07 0755) Pulse Rate:  [63-105] 70 (08/07 0800) Cardiac Rhythm:  [-] Normal sinus rhythm (08/07 0800) Resp:  [15-26] 15 (08/07 0800) BP: (82-184)/(41-97) 128/55 mmHg (08/07 0800) SpO2:  [90 %-99 %] 96 % (08/07 0800) Weight:  [305 lb 12.5 oz (138.7 kg)] 305 lb 12.5 oz (138.7 kg) (08/07 0500)  No results for input(s): GLUCAP in the last 168 hours.  Recent Labs Lab 10/01/14 1225 10/02/14 0145 10/03/14 0210  NA 138  138 138 137  K 3.6  3.4* 3.5 3.5  CL 103  100* 105 103  CO2 GLUCOSE 140*  144* 103* 119*  BUN 5*  CREATININE 1.04  1.00 1.06 0.81  CALCIUM 9.6 8.0* 8.4*    Recent Labs Lab 10/01/14 1225  AST 92*  ALT 151*  ALKPHOS 42  BILITOT 1.5*  PROT 7.6  ALBUMIN 4.3    Recent Labs Lab  10/01/14 1225 10/02/14 0145  WBC 10.1 8.9  NEUTROABS 6.0 5.4  HGB 16.8  17.3* 13.6  HCT 47.8  51.0 39.9  MCV 100.2* 101.8*  PLT 216 182   No results for input(s): CKTOTAL, CKMB, CKMBINDEX, TROPONINI in the last 168 hours.  Recent Labs  10/01/14 1225  LABPROT 14.0  INR 1.06    Recent Labs  10/02/14 0239  COLORURINE YELLOW  LABSPEC 1.044*  PHURINE 5.5  GLUCOSEU NEGATIVE  HGBUR NEGATIVE  BILIRUBINUR NEGATIVE  KETONESUR NEGATIVE  PROTEINUR NEGATIVE  UROBILINOGEN 0.2  NITRITE NEGATIVE  LEUKOCYTESUR NEGATIVE       Component Value Date/Time   CHOL 181 10/02/2014 0145   TRIG 383* 10/02/2014 0145   TRIG 383* 10/02/2014 0145   HDL 27* 10/02/2014 0145   CHOLHDL 6.7 10/02/2014 0145   VLDL 77* 10/02/2014 0145   LDLCALC 77 10/02/2014 0145   No results found for: HGBA1C    Component Value Date/Time   LABOPIA NONE DETECTED 10/02/2014 0239   COCAINSCRNUR NONE DETECTED 10/02/2014 0239   LABBENZ POSITIVE* 10/02/2014 0239   AMPHETMU NONE DETECTED 10/02/2014 0239   THCU NONE DETECTED 10/02/2014 0239   LABBARB NONE DETECTED 10/02/2014 0239     Recent Labs Lab 10/01/14 1237  ETH <5   Imaging    Ct Head Wo Contrast 10/01/2014    Normal noncontrast CT appearance of the brain.     Mr [name]  Head Wo Contrast 10/01/2014    No acute stroke.  No subtle signs of subarachnoid blood.   Moderate to severe narrowing of the distal LEFT PCA and distal LEFT MCA branches as well as the LEFT superior cerebellar artery.  Concern raised for reversible cerebral vasoconstriction syndrome.   Hypoplastic LEFT transverse sinus without signs of acute venous sinus thrombosis.      Portable Chest Xray 10/01/2014    Satisfactorily positioned endotracheal tube. Left lower lobe atelectasis or aspiration.       Mr Mrv Head Wo Cm 10/01/2014    No acute stroke.  No subtle signs of subarachnoid blood.   Moderate to severe narrowing of the distal LEFT PCA and distal LEFT MCA branches as well as  the LEFT superior cerebellar artery.  Concern raised for reversible cerebral vasoconstriction syndrome.   Hypoplastic LEFT transverse sinus without signs of acute venous sinus thrombosis.        PHYSICAL EXAM Obese middle aged Caucasian male not in distress. . Afebrile. Head is nontraumatic. Neck is supple without bruit.    Cardiac exam no murmur or gallop. Lungs are clear to auscultation. Distal pulses are well felt. Neurological Exam ;  Awake  Alert oriented x 3. Normal  But intermittent bit slow hesitant speech and language.eye movements full without nystagmus.fundi were not visualized. Vision acuity and fields appear normal. Hearing is normal. Palatal movements are normal. Face symmetric. Tongue midline. Normal strength, tone, reflexes and coordination. Normal sensation. Gait deferred.   ASSESSMENT/PLAN Mr. Reginald Gray is a 39 y.o. male with history of migraines, anxiety, OSA, obesity, hypertension, and anxiety presenting with headache, nausea, photophobia, phonophobia, speech difficulties, confusion, and numbness with paresthesias of both arms.  He did not receive IV t-PA due to late presentation.  Complicated migraine episode with reversible vasospasm treated with IA verapamil. No stroke seen on MRI  Resultant  No deficits  MRI  No acute stroke.   MRA  Concern raised for reversible cerebral vasoconstriction syndrome.  Hypoplastic LEFT transverse sinus without  signs of acute venous sinus thrombosis.    Carotid Doppler  not ordered  2D Echo  not ordered  LDL 77  HgbA1c pending  Lovenox for VTE prophylaxis Diet regular Room service appropriate?: Yes; Fluid consistency:: Thin  no antithrombotic prior to admission, now on no antithrombotic  Patient counseled to be compliant with his antithrombotic medications  Ongoing aggressive stroke risk factor management  Therapy recommendations:  Pending  Disposition: Pending  Hypertension  Home meds: Lotensin and  Coreg,  Added verapamil   Hyperlipidemia  Home meds: No lipid lowering medications prior to admission  LDL 77, goal < 70    Other Active Problems  Mildly elevated liver function tests  Hypotension  Other Pertinent History    Hospital day # 2  I have personally examined this patient, reviewed notes, independently viewed imaging studies, participated in medical decision making and plan of care. I have made any additions or clarifications directly to the above note. He has presented with..refractory migraine and focal neurological symptoms with vasospasm on catheter angio from reversible cerebral vasoconstriction syndrome likely triggered by relpax in setting of status migrainosus..  Continue verapamil for migraine prophylaxis   Keep SBP below 130. Use iv labetalol. Transfer to floor later today. Await therapy evaluation and likely Dc home in 1-2 days. Delia Heady, MD Medical Director [facility] Pager: [phone] 10/03/2014 12:39 PM      To contact Stroke Continuity provider, please refer to [email or web address].  After hours, contact General Neurology

## 2014-10-04 ENCOUNTER — Encounter (HOSPITAL_COMMUNITY): Payer: Self-pay | Admitting: Interventional Radiology

## 2014-10-04 DIAGNOSIS — G459 Transient cerebral ischemic attack, unspecified: Secondary | ICD-10-CM

## 2014-10-04 DIAGNOSIS — I67848 Other cerebrovascular vasospasm and vasoconstriction: Secondary | ICD-10-CM | POA: Insufficient documentation

## 2014-10-04 DIAGNOSIS — R51 Headache: Secondary | ICD-10-CM

## 2014-10-04 DIAGNOSIS — R519 Headache, unspecified: Secondary | ICD-10-CM | POA: Insufficient documentation

## 2014-10-04 DIAGNOSIS — G441 Vascular headache, not elsewhere classified: Secondary | ICD-10-CM

## 2014-10-04 LAB — HEMOGLOBIN A1C
HEMOGLOBIN A1C: 5.2 % (ref 4.8–5.6)
Mean Plasma Glucose: 103 mg/dL

## 2014-10-04 MED ORDER — TRAMADOL HCL 50 MG PO TABS: 100.0000 mg | ORAL_TABLET | Freq: Four times a day (QID) | ORAL | Status: AC | PRN

## 2014-10-04 MED ORDER — VERAPAMIL HCL ER 120 MG PO TBCR: 120.0000 mg | EXTENDED_RELEASE_TABLET | Freq: Every day | ORAL | Status: AC

## 2014-10-04 NOTE — Progress Notes (Signed)
Speech Language Pathology Treatment: Dysphagia;Cognitive-Linquistic  Patient Details Name: Reginald Gray MRN: [id number] DOB: [birth year] Today's Date: 10/04/2014 Time: 8110-3159 SLP Time Calculation (min) (ACUTE ONLY): 25 min  Assessment / Plan / Recommendation Clinical Impression  Skilled observation with regular/thin via cup without any s/s of aspiration noted and throat clearing not present as stated during BSE with pt being independent with utilizing slow rate/small sips and environmental distractions not an issue currently per pt and not observed by SLP; pt without c/o dysphagia as well; tolerating regular/thin diet well; continue current diet; cognitive tasks given with awareness, safety, judgment and problem solving all appearing within functional limits with functional tasks with minimal verbal cueing required by SLP; pt indicated he needed to "take it easy until he could get these headaches under control."  He also indicated he would take some time off of work until he felt he could 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  ST will s/o and no further ST recommended upon D/C unless changes occur with cognition.   HPI Other Pertinent Information: Reginald Gray is an 39 y.o. male with a history of migraines, hypertension, anxiety presenting with 1.5 week history of severe generalized pounding headache refractory to multiple medications. Has associated nausea, photo and phonophobia. Denies any visual changes. Developed numbness and paresthesias in bilateral arms R>L. MRI demonstrated no CVA but possible reversible cerebral vasospasm of the L superior cerebellar artery. He was taken to IR where he received intra-arterial verapamil to b/l ICA with marked improvement of hemodynamic flow. Pt also experienced speech and language deficits and confusion.  Intubated for a procedure on 10-01-14, extubated this morning.    Pertinent Vitals Pain Assessment: No/denies pain Pain Score: 0-No  pain  SLP Plan  Discharge SLP treatment due to (comment) (goals met)    Recommendations Diet recommendations: Regular;Thin liquid Liquids provided via: Cup;Straw Medication Administration: Whole meds with liquid Supervision: Patient able to self feed Compensations: Minimize environmental distractions;Slow rate;Small sips/bites Postural Changes and/or Swallow Maneuvers: Seated upright 90 degrees              Oral Care Recommendations: Oral care BID Follow up Recommendations: None;Other (comment) (Pt instructed if any changes occur to consult ST with PCP) Plan: Discharge SLP treatment due to (comment) (goals met)         ADAMS,PAT, M.S., CCC-SLP 10/04/2014, 1:55 PM

## 2014-10-04 NOTE — Progress Notes (Signed)
Discharge orders received.  Discharge instructions and follow-up appointments reviewed with the patient and his mom.  VSS upon discharge.  IV removed and education complete.  Pt and his mom informed to pick up the patient's tramadol prescription from the Dr's office tomorrow morning.  Pt walked out to main entrance. Sondra Come, RN

## 2014-10-04 NOTE — Discharge Summary (Signed)
Physician Discharge Summary  Patient ID: Reginald Gray MRN: [id number] DOB/AGE: 09-11-1975 39 y.o.  Admit date: 10/01/2014 Discharge date: 10/04/2014  Admission Diagnoses: Headache, aphasia, weakness  Discharge Diagnoses: Headache and transient aphasia with right-sided weakness secondary to complicated migraine with vasospasm intracerebral vessels due to reversible cerebrovascular vasoconstriction syndrome likely induced by migraine and vasoactive effects of caffeine and Relpax Active Problems:   Reversible cerebrovascular vasoconstriction syndrome   Aphasia   Headache   Vasospasm of cerebral artery   Discharged Condition: good  Hospital Course: [name] is an 39 y.o. male migraines, hypertension, anxiety presenting with 1.5 week history of severe generalized pounding headache refractory to multiple medications. Has associated nausea, photo and phonophobia. Denies any visual changes. Yesterday developed numbness and paresthesias in bilateral arms R>L. This continued today. Around 0930 today his mom noted that he was having speech difficulties. Described as difficulty getting words out and confusion. EMS notified and code stroke activated. Patient has been treated by his PCP for his headache. Has received IV toradol and Relpax x 2 with no symptomatic benefit. CT head imaging reviewed and overall unremarkable with no acute process. In the ED received benadryl  x 1 and compazine  x 1.  Date last known well: 08/05 Time last known well: unclear tPA Given: no, outside tPA window Modified Rankin: Rankin Score=0. The patient underwent emergent MRI scan of the brain due to his aphasia and transient right-sided weakness which did not show an acute stroke but MRA showed moderate to severe narrowing of the distal left posterior cerebral artery and distal left MCA branches is present left superior cerebellar artery raising concern for vasospasm and future risk for stroke. Patient underwent  emergent cerebral catheter angiogram by Dr. Corliss Skains which showed mild to moderate delayed hemodynamic flow in both middle cerebral arteries, parietal branches and after discussion of risk and benefit with the patient and family it was decided to administer 4.5 mg of intra-arterial verapamil into the left ICA and 2.4 mg into the right ICA which led to significantly improved hemodynamic flow. Patient's neurological deficit resolved. His headache also improved. Repeat MRA performed the next day revealed complete reversal of vasospasm with normal flow in both middle cerebral and posterior cerebral arteries. Patient's blood pressure was tightly controlled during hospitalization knee was given when necessary doses of IV labetalol and his home medications were reviewed and renewed. He was also started on verapamil CR120 mg daily for migraine prophylaxis. He was seen by physical and occupational therapy and felt to have returned to his baseline and stable to be discharged home. I spoke to patient's mother over the phone and patient was to go and live with her after discharge. He was advised to be compliant with his home medications and to keep follow-up appointment with me in 2 months and with his primary physician in a few weeks.   Consults: Dr Corliss Skains -interventional neuroradiologist  Ct Head Wo Contrast 10/01/2014  Normal noncontrast CT appearance of the brain.  Mr Reginald Gray Head Wo Contrast 10/01/2014  No acute stroke. No subtle signs of subarachnoid blood.  Moderate to severe narrowing of the distal LEFT PCA and distal LEFT MCA branches as well as the LEFT superior cerebellar artery.  Concern raised for reversible cerebral vasoconstriction syndrome.  Hypoplastic LEFT transverse sinus without signs of acute venous sinus thrombosis.  Portable Chest Xray 10/01/2014  Satisfactorily positioned endotracheal tube. Left lower lobe atelectasis or aspiration.   Repeat MRA Brain 10/02/14 :  normal  Treatments:  IA administration of 4.5 mg of Verapamil into Lt ICA and 2.4 mg into RT ICA with sig improved hemodynamic flow.   Discharge Exam: Blood pressure 127/79, pulse 73, temperature 98.1 F (36.7 C), temperature source Oral, resp. rate 20, height 6' (1.829 m), weight 305 lb 12.5 oz (138.7 kg), SpO2 97 %.  PHYSICAL EXAM Obese middle aged Caucasian male not in distress. . Afebrile. Head is nontraumatic. Neck is supple without bruit. Cardiac exam no murmur or gallop. Lungs are clear to auscultation. Distal pulses are well felt. Neurological Exam ;  Awake Alert oriented x 3. Normal But intermittent bit slow hesitant speech and language.eye movements full without nystagmus.fundi were not visualized. Vision acuity and fields appear normal. Hearing is normal. Palatal movements are normal. Face symmetric. Tongue midline. Normal strength, tone, reflexes and coordination. Normal sensation. Gait deferred.   Disposition: Home     Medication List    ASK your doctor about these medications        aspirin-acetaminophen-caffeine 250-250-65 MG per tablet  Commonly known as:  EXCEDRIN MIGRAINE  Take 1 tablet by mouth every 6 (six) hours as needed for headache or migraine.     benazepril 40 MG tablet  Commonly known as:  LOTENSIN  Take 40 mg by mouth daily.     busPIRone 5 MG tablet  Commonly known as:  BUSPAR  Take 1 tablet up to twice a day as needed for stress.     carvedilol 12.5 MG tablet  Commonly known as:  COREG  Take 12.5 mg by mouth 2 (two) times daily with a meal.     diclofenac sodium 1 % Gel  Commonly known as:  VOLTAREN  Apply 4 g topically 4 (four) times daily.     furosemide 20 MG tablet  Commonly known as:  LASIX  Take 20 mg by mouth daily.     ketorolac 30 MG/ML injection  Commonly known as:  TORADOL  Inject 60 mg into the vein once.     omeprazole 20 MG capsule  Commonly known as:  PRILOSEC  Take 20 mg by mouth daily as needed (heartburn).      testosterone 50 MG/5GM (1%) Gel  Commonly known as:  ANDROGEL  Place 5 g onto the skin daily.     Vitamin D (Ergocalciferol) 50000 UNITS Caps capsule  Commonly known as:  DRISDOL  TAKE 1 CAPSULE (50,000 UNITS TOTAL) BY MOUTH TWICE PER WEEK Verapamil CR 120 mg daily           Follow-up Information    Follow up with Jaydynn Wolford, MD In 2 months.   Specialties:  Neurology, Radiology   Why:  stroke clinic   Contact information:   [address] [place] [address] [phone] F/U with primary MD in 2 weeks      Signed: Hulet Ehrmann 10/04/2014, 4:18 PM

## 2014-10-04 NOTE — Progress Notes (Signed)
Occupational Therapy Treatment Patient Details Name: Reginald Gray MRN: [id number] DOB: Feb 10, 1976 Today's Date: 10/04/2014    History of present illness 39 yo M with a chief complaint of headache. This been going on for at least 10 days. Patient saw his PCP 3 days ago was given 2 shots with some mild improvement. This morning patient suddenly had difficulty speaking was complaining of paresthesias generally just before that happened. Having severe photophobia and severe worsening of headache as well. Family states that he has had migraines in the past but they resolved in his 55s. Family denies head injury. History of hypertension denies history of diabetes hyperlipidemia. CT head imaging reviewed and overall unremarkable with no acute process.    OT comments  Pt feels that he is close to baseline. Educated pt on signs/symptoms of CVA. Pt verbalized understanding. Pt safe to D/C home when medically stable.   Follow Up Recommendations  No OT follow up;Supervision - Intermittent    Equipment Recommendations  None recommended by OT    Recommendations for Other Services      Precautions / Restrictions Precautions Precautions: None       Mobility Bed Mobility                  Transfers Overall transfer level: Independent                    Balance Overall balance assessment: No apparent balance deficits (not formally assessed)                                 ADL Overall ADL's : At baseline;Modified independent                                              Vision                 Additional Comments: Pt reports vision is almost at baseline   Perception     Praxis      Cognition   Behavior During Therapy: [facility] for tasks assessed/performed Overall Cognitive Status: Within Functional Limits for tasks assessed                       Extremity/Trunk Assessment     BUE strength/sensation [facility]                       General Comments      Pertinent Vitals/ Pain       Pain Assessment: No/denies pain  Home Living                                          Prior Functioning/Environment              Frequency       Progress Toward Goals  OT Goals(current goals can now be found in the care plan section)  Progress towards OT goals: Goals met/education completed, patient discharged from OT  Acute Rehab OT Goals Patient Stated Goal: to go home OT Goal Formulation: With patient Time For Goal Achievement: 10/17/14 Potential to Achieve Goals: Good ADL Goals Additional ADL Goal #2: Patient will be able to independently  verbalize needs for ADL 4/5 times   Plan All goals met and education completed, patient discharged from OT services    Co-evaluation                 End of Session     Activity Tolerance Patient tolerated treatment well   Patient Left in bed;with call bell/phone within reach   Nurse Communication Mobility status        Time: 6579-0383 OT Time Calculation (min): 16 min  Charges: OT General Charges $OT Visit: 1 Procedure OT Treatments $Therapeutic Activity: 8-22 mins  Kathrina Crosley,HILLARY 10/04/2014, 10:22 AM   Maurie Boettcher, OTR/L  [phone] 10/04/2014

## 2014-10-05 ENCOUNTER — Telehealth: Payer: Self-pay | Admitting: Neurology

## 2014-10-05 NOTE — Telephone Encounter (Signed)
Patient called stating he does not have RX for traMADol (ULTRAM) 50 MG tablet which Dr Pearlean Brownie prescibed when he was in hospital. Please call and advise. Patient can be reached at [phone].

## 2014-10-06 ENCOUNTER — Telehealth: Payer: Self-pay

## 2014-10-06 NOTE — Telephone Encounter (Signed)
Talk to patients mom Reginald Gray about her son ultram medication. Rn explain to his mom that medication was sent by Shanda Bumps our pharmacist. I explain to her to give it an hour before she picks up the [facility].

## 2014-10-06 NOTE — Telephone Encounter (Signed)
Agree with your advice.

## 2014-10-06 NOTE — Telephone Encounter (Signed)
Talk to pt and his mom Reginald Gray  about his ultram and blood pressure medications. Rn explain to patient that [place] from [facility](phamracy) was call and the ultram was ready for pick up at 0330pm. Also RN explain to pt and his mom that on Dr. Pearlean Brownie discharge summary there is no mention of any new blood pressure medications. Also as his discharge stated he is to continue the coreg, and lotensin as the summary states. Pt and mom states they understand about the blood pressure medication, and will pick up the ultram presciriptions.

## 2014-10-06 NOTE — Telephone Encounter (Signed)
Patient's Mom is calling back to discuss another medication that  Dr. Pearlean Brownie stated that the patient should be on for blood pressure. She does not know the name. Please call.

## 2014-10-06 NOTE — Telephone Encounter (Signed)
Rx has been phoned in to the pharmacy, on doctor line.  I called Ms Ching back.  She is aware.

## 2014-10-06 NOTE — Telephone Encounter (Signed)
Calling again to check the status of the Tramadol prescription.  He is out and needs today.  If this is something that can be called in he uses [facility].  Please call today.

## 2014-10-06 NOTE — Telephone Encounter (Signed)
Patient's mother calling regarding RX for Tramadol. States pt is in pain. Please call and advise.She can be reached at [phone]

## 2014-10-07 NOTE — Telephone Encounter (Signed)
Patient's mother is calling back. The patient started taking traMADol (ULTRAM) 50 MG tablet last night and today he has been having severe headaches and vomiting. Please call.

## 2014-10-08 ENCOUNTER — Emergency Department (HOSPITAL_COMMUNITY)
Admission: EM | Admit: 2014-10-08 | Discharge: 2014-10-08 | Disposition: A | Discharge status: Home / Self Care | Payer: Self-pay | Attending: Emergency Medicine | Admitting: Emergency Medicine

## 2014-10-08 ENCOUNTER — Encounter (HOSPITAL_COMMUNITY): Payer: Self-pay | Admitting: Emergency Medicine

## 2014-10-08 DIAGNOSIS — G4733 Obstructive sleep apnea (adult) (pediatric): Secondary | ICD-10-CM | POA: Insufficient documentation

## 2014-10-08 DIAGNOSIS — Z9981 Dependence on supplemental oxygen: Secondary | ICD-10-CM | POA: Insufficient documentation

## 2014-10-08 DIAGNOSIS — I10 Essential (primary) hypertension: Secondary | ICD-10-CM | POA: Insufficient documentation

## 2014-10-08 DIAGNOSIS — F419 Anxiety disorder, unspecified: Secondary | ICD-10-CM | POA: Insufficient documentation

## 2014-10-08 DIAGNOSIS — Z880 Allergy status to penicillin: Secondary | ICD-10-CM | POA: Insufficient documentation

## 2014-10-08 DIAGNOSIS — Z79899 Other long term (current) drug therapy: Secondary | ICD-10-CM | POA: Insufficient documentation

## 2014-10-08 DIAGNOSIS — G43809 Other migraine, not intractable, without status migrainosus: Secondary | ICD-10-CM | POA: Insufficient documentation

## 2014-10-08 DIAGNOSIS — E669 Obesity, unspecified: Secondary | ICD-10-CM | POA: Insufficient documentation

## 2014-10-08 DIAGNOSIS — Z791 Long term (current) use of non-steroidal anti-inflammatories (NSAID): Secondary | ICD-10-CM | POA: Insufficient documentation

## 2014-10-08 MED ORDER — SODIUM CHLORIDE 0.9 % IV SOLN
Freq: Once | INTRAVENOUS | Status: AC
  Administered 2014-10-08: 03:00:00 via INTRAVENOUS

## 2014-10-08 MED ORDER — DIPHENHYDRAMINE HCL 50 MG/ML IJ SOLN
25.0000 mg | Freq: Once | INTRAMUSCULAR | Status: AC
  Administered 2014-10-08: 25 mg via INTRAVENOUS
  Filled 2014-10-08: qty 1

## 2014-10-08 MED ORDER — METOCLOPRAMIDE HCL 5 MG/ML IJ SOLN
10.0000 mg | Freq: Once | INTRAMUSCULAR | Status: AC
  Administered 2014-10-08: 10 mg via INTRAVENOUS
  Filled 2014-10-08: qty 2

## 2014-10-08 MED ORDER — KETOROLAC TROMETHAMINE 30 MG/ML IJ SOLN
30.0000 mg | Freq: Once | INTRAMUSCULAR | Status: AC
  Administered 2014-10-08: 30 mg via INTRAVENOUS
  Filled 2014-10-08: qty 1

## 2014-10-08 NOTE — ED Notes (Signed)
Pt. reports headache with emesis and photophobia onset this week unrelieved by prescription medication .

## 2014-10-08 NOTE — Telephone Encounter (Signed)
I spoke to the patient's mother and clarified that he was prescribed verapamil for migraine prophylaxis and not for hypertension. Patient was seen in the ER yesterday for worsening headache but today is doing much better with headache resolved

## 2014-10-08 NOTE — ED Notes (Signed)
Pt verbalized understanding of d/c instructions and has no further questions. Pt educated on pain medication use for migraines and follow up with neurology. Pt stable and NAD.

## 2014-10-08 NOTE — Discharge Instructions (Signed)
Migraine Headache A migraine headache is very bad, throbbing pain on one or both sides of your head. Talk to your doctor about what things may bring on (trigger) your migraine headaches. HOME CARE  Only take medicines as told by your doctor.  Lie down in a dark, quiet room when you have a migraine.  Keep a journal to find out if certain things bring on migraine headaches. For example, write down:  What you eat and drink.  How much sleep you get.  Any change to your diet or medicines.  Lessen how much alcohol you drink.  Quit smoking if you smoke.  Get enough sleep.  Lessen any stress in your life.  Keep lights dim if bright lights bother you or make your migraines worse. GET HELP RIGHT AWAY IF:   Your migraine becomes really bad.  You have a fever.  You have a stiff neck.  You have trouble seeing.  Your muscles are weak, or you lose muscle control.  You lose your balance or have trouble walking.  You feel like you will pass out (faint), or you pass out.  You have really bad symptoms that are different than your first symptoms. MAKE SURE YOU:   Understand these instructions.  Will watch your condition.  Will get help right away if you are not doing well or get worse. Document Released: 11/22/2007 Document Revised: 05/07/2011 Document Reviewed: 10/20/2012 [facility] Patient Information 2015 [place], [place]. This information is not intended to replace advice given to you by your health care provider. Make sure you discuss any questions you have with your health care provider. Make sure you take your medications as directed.  Follow-up with the neurologist per previous instructions

## 2014-10-08 NOTE — ED Provider Notes (Signed)
CSN: [id number]     Arrival date & time 10/08/14  0044 History   First MD Initiated Contact with Patient 10/08/14 0238     Chief Complaint  Patient presents with  . Migraine     (Consider location/radiation/quality/duration/timing/severity/associated sxs/prior Treatment) HPI Comments: Patient had recent admission for complicated migraines.  He had MRI/MRA, Dr. Gavin Pound Schwann intervascular injection of verapamil for vascular spasm with relief of his symptoms.  He was discharged home with by mouth verapamil prescriptions which he did not fill 4 day and half.  Presents tonight with repeat headache, nausea, vomiting, photophobia.  Patient is a 39 y.o. male presenting with migraines. The history is provided by the patient and a relative.  Migraine This is a recurrent problem. The current episode started today. The problem occurs intermittently. The problem has been unchanged. Associated symptoms include headaches, nausea, numbness and vomiting. Pertinent negatives include no chills, fever or weakness. Treatments tried: Prescribed medications. The treatment provided no relief.    Past Medical History  Diagnosis Date  . Migraines   . Hypertension   . Low testosterone   . Vitamin D deficiency disease   . Anxiety   . Elevated LFTs   . Obesity   . Hypogonadism, male   . OSA on CPAP    Past Surgical History  Procedure Laterality Date  . Radiology with anesthesia N/A 10/01/2014    Procedure: RADIOLOGY WITH ANESTHESIA;  Surgeon: Julieanne Cotton, MD;  Location: [facility] OR;  Service: Radiology;  Laterality: N/A;   No family history on file. Social History  Substance Use Topics  . Smoking status: Never Smoker   . Smokeless tobacco: None  . Alcohol Use: None    Review of Systems  Constitutional: Negative for fever and chills.  Eyes: Positive for photophobia.  Gastrointestinal: Positive for nausea and vomiting.  Neurological: Positive for numbness and headaches. Negative for seizures and  weakness.  All other systems reviewed and are negative.     Allergies  Hydrochlorothiazide and Penicillins  Home Medications   Prior to Admission medications   Medication Sig Start Date End Date Taking? Authorizing Provider  benazepril (LOTENSIN) 40 MG tablet Take 40 mg by mouth daily.    Yes Historical Provider, MD  busPIRone (BUSPAR) 5 MG tablet Take 1 tablet up to twice a day as needed for stress. 06/18/14  Yes Historical Provider, MD  carvedilol (COREG) 12.5 MG tablet Take 12.5 mg by mouth 2 (two) times daily with a meal.   Yes Historical Provider, MD  diclofenac sodium (VOLTAREN) 1 % GEL Apply 4 g topically 4 (four) times daily.   Yes Historical Provider, MD  furosemide (LASIX) 20 MG tablet Take 20 mg by mouth daily.  06/18/14  Yes Historical Provider, MD  omeprazole (PRILOSEC) 20 MG capsule Take 20 mg by mouth daily as needed (heartburn).    Yes Historical Provider, MD  testosterone (ANDROGEL) 50 MG/5GM (1%) GEL Place 5 g onto the skin daily.   Yes Historical Provider, MD  traMADol (ULTRAM) 50 MG tablet Take 2 tablets (100 mg total) by mouth every 6 (six) hours as needed. 10/04/14  Yes Micki Riley, MD  verapamil (CALAN-SR) 120 MG CR tablet Take 1 tablet (120 mg total) by mouth at bedtime. 10/04/14  Yes Micki Riley, MD  Vitamin D, Ergocalciferol, (DRISDOL) 50000 UNITS CAPS capsule TAKE 1 CAPSULE (50,000 UNITS TOTAL) BY MOUTH TWICE PER WEEK 09/24/13  Yes Historical Provider, MD   BP 152/81 mmHg  Pulse 62  Temp(Src) 97.7  F (36.5 C)  Resp 16  Ht 5\' 11"  (1.803 m)  Wt 291 lb (131.997 kg)  BMI 40.60 kg/m2  SpO2 93% Physical Exam  Constitutional: He appears well-developed and well-nourished.  HENT:  Head: Normocephalic.  Neck: Normal range of motion.  Cardiovascular: Normal rate and regular rhythm.   Musculoskeletal: Normal range of motion.  Neurological: He is alert.  Skin: Skin is warm and dry.  Nursing note and vitals reviewed.   ED Course  Procedures (including  critical care time) Labs Review Labs Reviewed - No data to display  Imaging Review No results found. I, Shaneal Barasch K, personally reviewed and evaluated these images and lab results as part of my medical decision-making.   EKG Interpretation None     After careful review of patient's records.  He was given the headache cocktail, which include Reglan, Benadryl and Toradol will be reassessed Patient reassessed after receiving medication reports that the headache is much better. He should again assessed.  He says that his headache is gone.  He is ready to sleep in his own bed MDM   Final diagnoses:  Other migraine without status migrainosus, not intractable         Earley Favor, NP 10/08/14 2130  Dione Booze, MD 10/08/14 [phone]

## 2014-10-12 ENCOUNTER — Other Ambulatory Visit: Payer: Self-pay | Admitting: Neurology

## 2014-10-12 ENCOUNTER — Telehealth: Payer: Self-pay | Admitting: Neurology

## 2014-10-12 DIAGNOSIS — G43919 Migraine, unspecified, intractable, without status migrainosus: Secondary | ICD-10-CM

## 2014-10-12 MED ORDER — TOPIRAMATE 50 MG PO TABS: 100.0000 mg | ORAL_TABLET | Freq: Two times a day (BID) | ORAL | Status: AC

## 2014-10-12 NOTE — Telephone Encounter (Signed)
I spoke to the patient . He is still having headaches though not as frequent and is also complaining of left-sided posterior neck pain. I recommend he start Topamax 50 mg daily for one week and then twice daily. I will also try to see him within the next 1 week in the office.

## 2014-10-12 NOTE — Telephone Encounter (Signed)
Pt's mother called and states that pt had emergency procedure done and is complaining about neck pain, has some residual head pain( getting better). Pt is still having headaches. Left side of neck hurts. Please call and advise [phone]. Thank you.

## 2014-10-13 ENCOUNTER — Telehealth: Payer: Self-pay

## 2014-10-13 NOTE — Telephone Encounter (Signed)
Spoke to patient tried to get a sooner appt. Dr. Pearlean Brownie wants to see patient sooner, but schedule is full. Patient will be seeing Dr Roda Shutters next Wednesday. Patient has an appt schedule on 10-20-14 at 0300pm. We need to check with Angie in billing on 10-14-14 about balance. Patient is aware of this issue. He was told to go to the ER if it gets worse.

## 2014-10-13 NOTE — Progress Notes (Signed)
Patient ID: Reginald Gray, male   DOB: [birth year], 39 y.o.   MRN: [id number]   Pt was admitted through the ED 10/01/14 for continued migrainous headache Cerebral arteriogram was performed  Vasospasm was determined  Reginald Corliss Skains performed cerebral arteriogram with verapamil infusion for vasosapsm  Endovascular resolution of vasospasm of the distal posterior frontal in the anterior parietal occipital MCA branches using intra-arterial infusion of verapamil.  Mastoid was then obtained into the left and right internal carotid artery's you over a 0.035 inch roadrunner guidewire. After having confirmed safe positioning of the tips of the 5 [place] diagnostic catheter in the distal cervical left and right ICA is sequentially, approximately 4.5 mg of verapamil was infused into the left internal carotid artery, the followed by 2.4 mg in the right internal carotid Artery  Pt was discharged per Reginald Gray and to follow with him as needed  Pt has called today to complain of continued headache and dizziness He states his headache is located L side of head Posterior to ear Heat pad does help somewhat Has caused N/V but none in a few days Has caused visual changes of blunted peripheral vision- after pt had been out for few hrs and was extremely tired. No visual disturbance in 2 days Taking Tramadol as needed Was Rx Topomax per Reginald Gray office just 8/16 Has taken only one dose thus far. New medication is causing pt to feel like "zombie"  He is asking what is best next step for his care. Reginald Corliss Skains is out of office I spoke to Reginald [address] He asked me to call Reginald Gray office to see if they need to see pt or shall I send him to ED.  Spoke to Reginald Pearlean Brownie RN- she is familiar with pt and recent complaints. Rec: continue to take new medication to see if sxs will subside Or present to ED for evaluation if pt feels sxs are worsening or feels urgency to do so Neuro office cannot see pt this week secondary  full schedule  I suggested pt could call office to see if they could fit him in over next several days or present to ED He has good understanding of options and proceed as necessary.

## 2014-10-13 NOTE — Telephone Encounter (Signed)
Rn spoke to patient and stated he can tolerate the headaches with the meds. He would like to be seen by someone at [facility] office. During the phone call. Dr Pearlean Brownie stated he will talk to one of the other md at [facility] to see can they see him this week. Nurse explain to patient he will get a call back, if a appt is open. Nurse explain to patient if he feels the need for the emergency room don't hesitate to go.

## 2014-10-13 NOTE — Telephone Encounter (Signed)
Rn receive a call from Dr.Deveshwar office that pt is complaining of major headache. Dr. Pearlean Brownie did speak with patient on Tuesday and prescribed him another Topamax. Nurse explain to [name](PA) at office that pt can go to the emergency room if it becomes major.Reginald Gray

## 2014-10-14 ENCOUNTER — Other Ambulatory Visit: Payer: Self-pay | Admitting: Radiology

## 2014-10-14 NOTE — Progress Notes (Signed)
Patient ID: Reginald Gray, male   DOB: [birth year], 39 y.o.   MRN: [id number]

## 2014-10-15 NOTE — Telephone Encounter (Signed)
Left message on patients cell number. Left message that his appt has been move to 10-18-14 at 0400 with Dr. Lucia Gaskins. Dr Lucia Gaskins is aware pt has been schedule.

## 2014-10-18 ENCOUNTER — Ambulatory Visit (INDEPENDENT_AMBULATORY_CARE_PROVIDER_SITE_OTHER): Payer: Self-pay | Admitting: Neurology

## 2014-10-18 DIAGNOSIS — R51 Headache: Secondary | ICD-10-CM

## 2014-10-18 NOTE — Telephone Encounter (Signed)
LT voice message for patient to confirm appointment.  Pt has appt today with Dr. Lucia Gaskins for major headaches at 0400pm.If patient calls back tell him to confirm appt for today.

## 2014-10-18 NOTE — Progress Notes (Signed)
No show

## 2014-10-20 ENCOUNTER — Ambulatory Visit: Payer: Self-pay | Admitting: Neurology

## 2014-12-06 ENCOUNTER — Ambulatory Visit: Payer: Self-pay | Admitting: Neurology

## 2014-12-07 ENCOUNTER — Encounter: Payer: Self-pay | Admitting: Neurology

## 2016-06-19 IMAGING — CT CT HEAD W/O CM
2 series · 16 of 30 positions shown, 18 images · non-contrast
Comparison: None.

CLINICAL DATA: 39-year-old male code stroke. Headache for 1 week
now with Yolis aphasia and right-sided weakness. Initial
encounter.

EXAM:
CT HEAD WITHOUT CONTRAST
TECHNIQUE: Contiguous axial images were obtained from the base of the skull
through the vertex without intravenous contrast.

[Series 201: head w/o, idose (1) · axial · non-contrast · 0.49mm/px · z∈[+88,+223]mm · 8 of 35 slices shown, 10 images]
[im 4/35  brain]
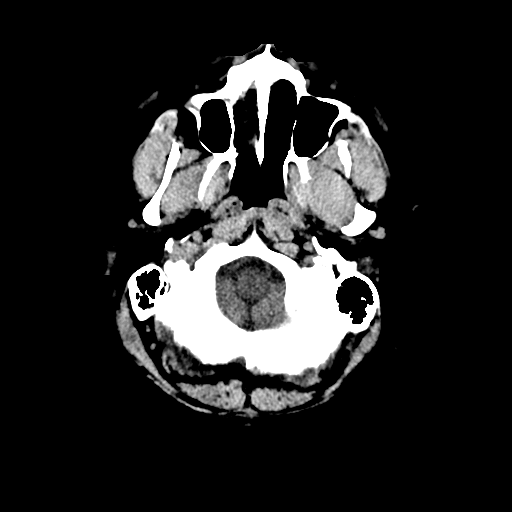
[im 4/35  bone]
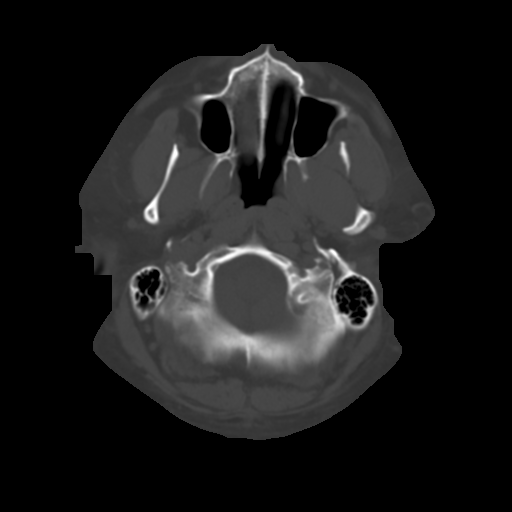
[im 8/35  brain]
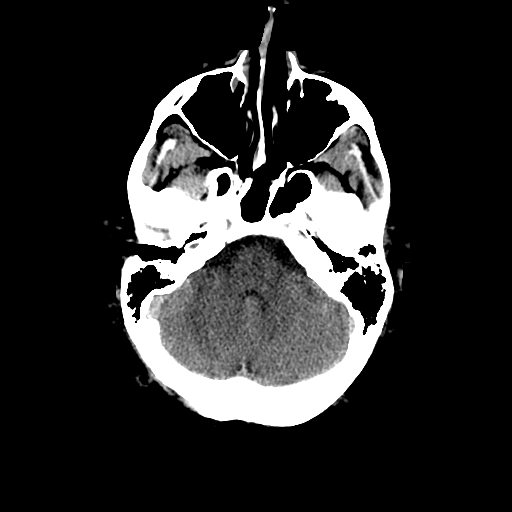
[im 12/35  brain]
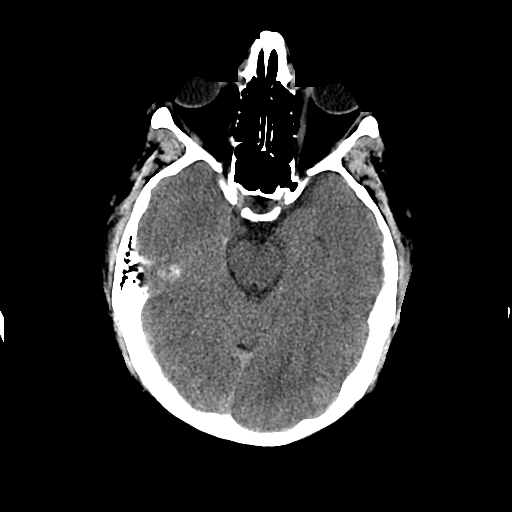
[im 16/35  brain]
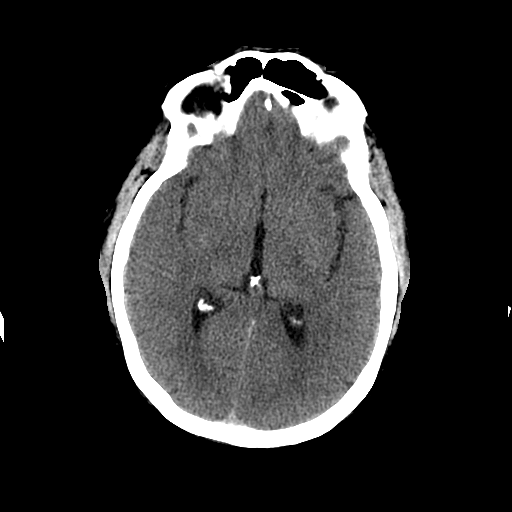
[im 19/35  brain]
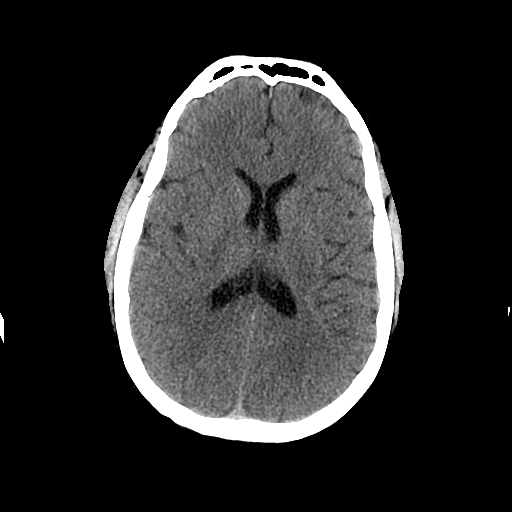
[im 19/35  bone]
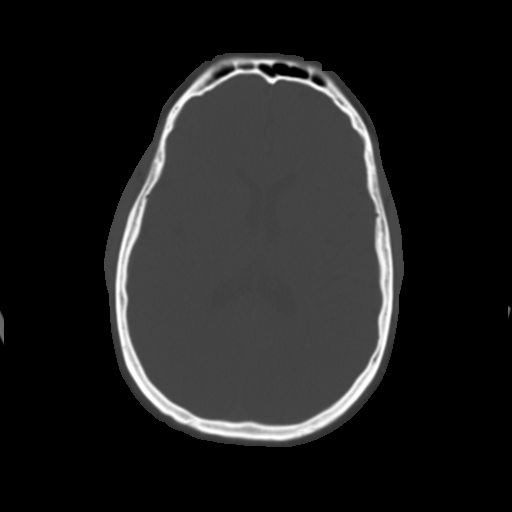
[im 23/35  brain]
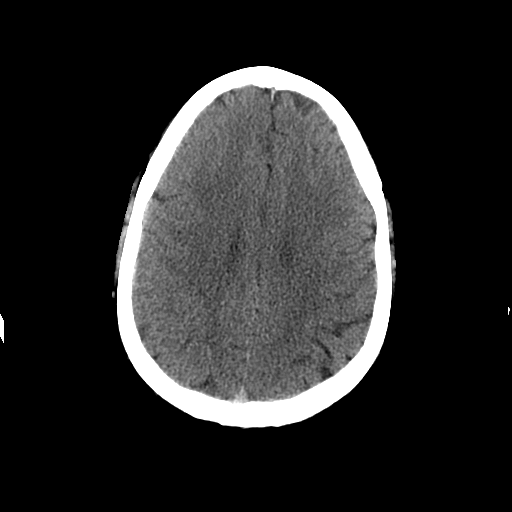
[im 27/35  brain]
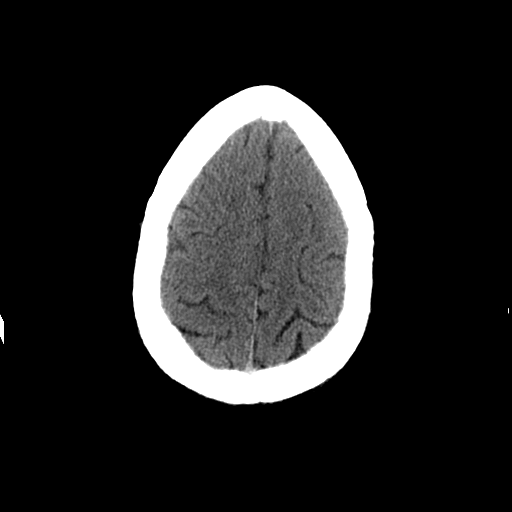
[im 31/35  brain]
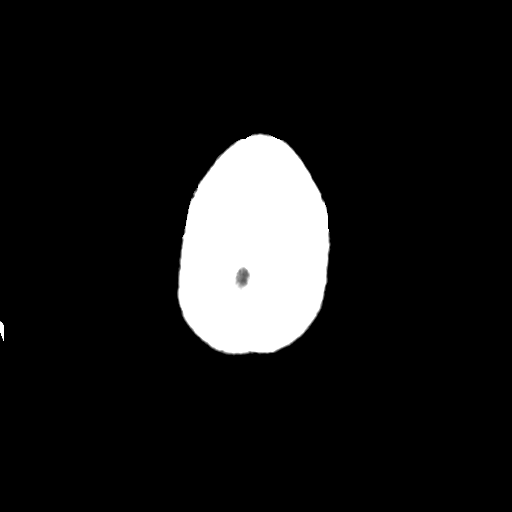

[Series 202: head w/o bone, idose (1) · axial · non-contrast · 0.49mm/px · z∈[+89,+224]mm · 8 of 70 slices shown]
[im 8/70  bone]
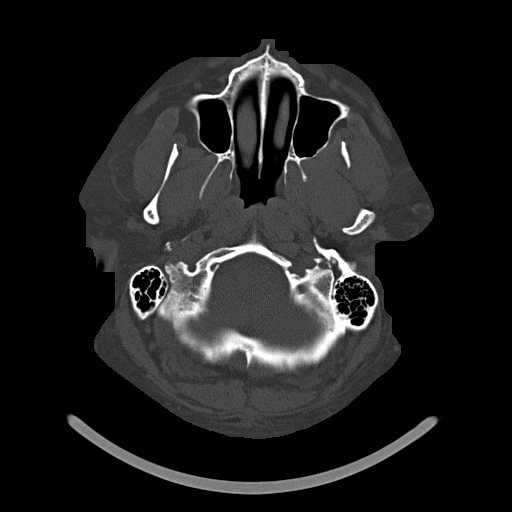
[im 15/70  bone]
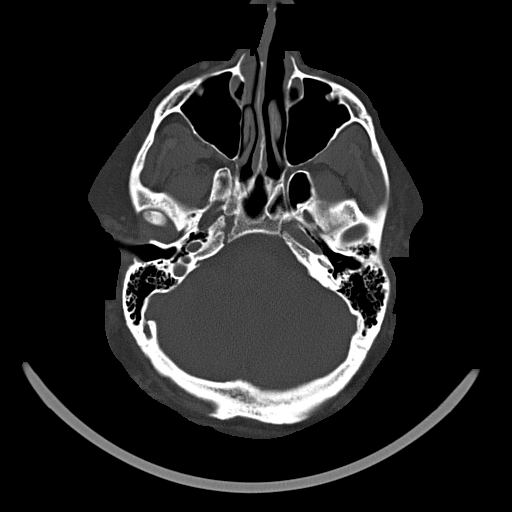
[im 22/70  bone]
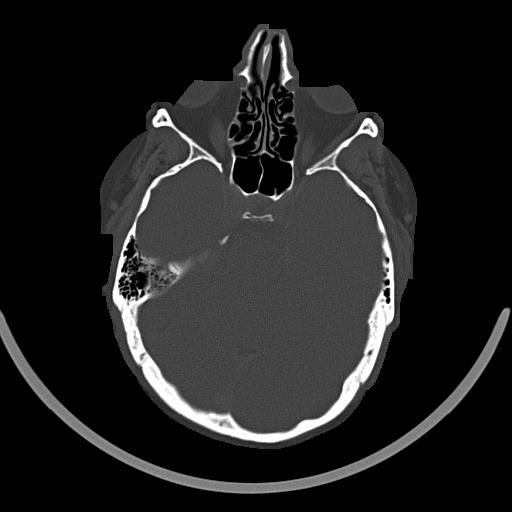
[im 30/70  bone]
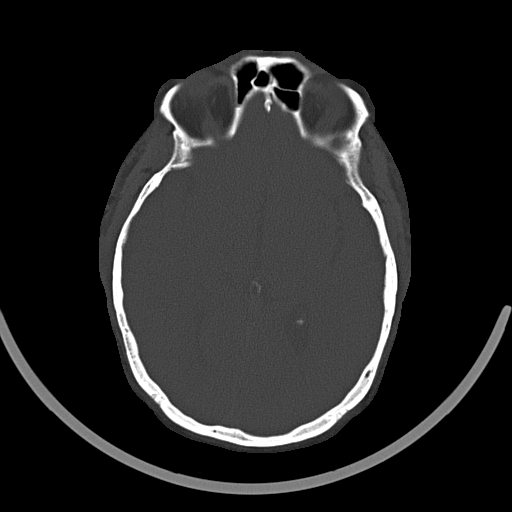
[im 40/70  bone]
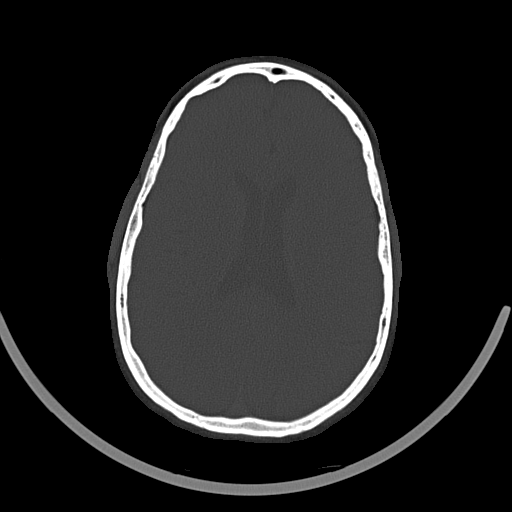
[im 48/70  bone]
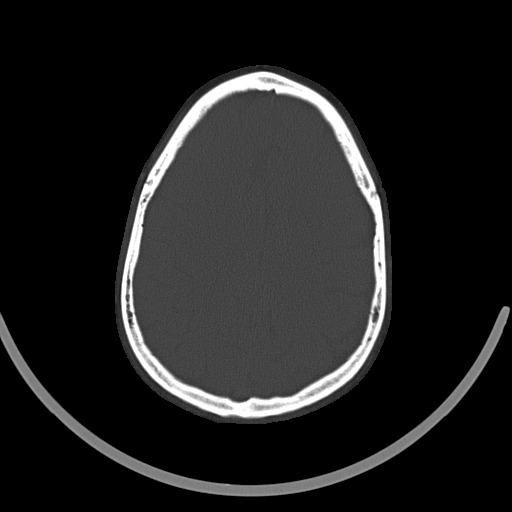
[im 55/70  bone]
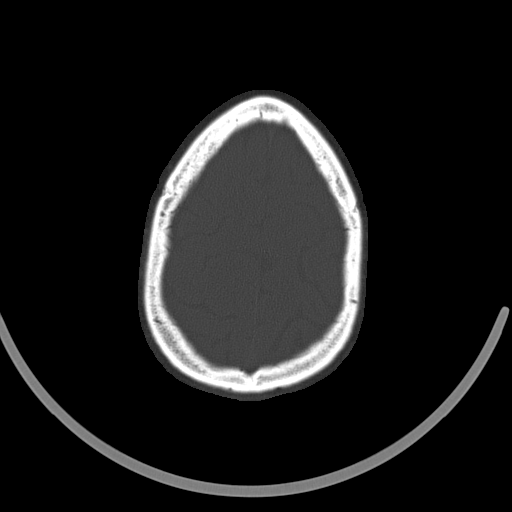
[im 62/70  bone]
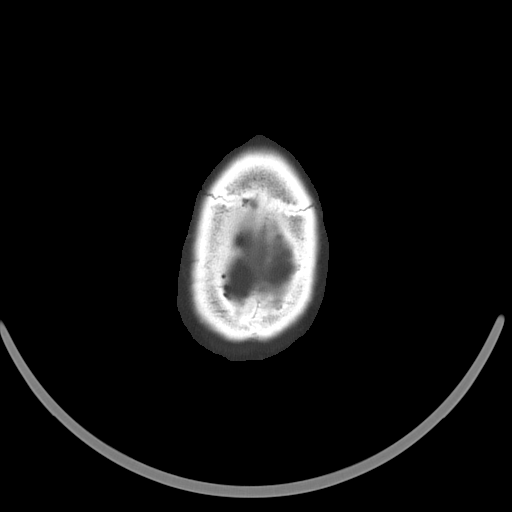

[16 of 30 positions shown; findings below may reference images not displayed]

FINDINGS: Visualized paranasal sinuses and mastoids are clear. No acute
osseous abnormality identified. Visualized orbits and scalp soft
tissues are within normal limits.

Cerebral volume is normal. No intracranial mass effect or
hemorrhage. Gray-white matter differentiation remains within normal
limits throughout the left hemisphere. No acute cortically based
infarct identified. No suspicious intracranial vascular
hyperdensity. No intracranial mass identified.
IMPRESSION: Normal noncontrast CT appearance of the brain.

Study discussed by telephone with Neurology Dr. Electronica on 10/01/2014
at 4333 hours.

## 2018-12-19 ENCOUNTER — Encounter (INDEPENDENT_AMBULATORY_CARE_PROVIDER_SITE_OTHER): Payer: Self-pay

## 2019-01-20 ENCOUNTER — Other Ambulatory Visit: Payer: Self-pay

## 2019-01-20 DIAGNOSIS — K624 Stenosis of anus and rectum: Secondary | ICD-10-CM

## 2019-01-21 LAB — NOVEL CORONAVIRUS, NAA: SARS-CoV-2, NAA: NOT DETECTED

## 2019-01-26 ENCOUNTER — Other Ambulatory Visit: Payer: Self-pay

## 2019-01-26 DIAGNOSIS — Z20822 Contact with and (suspected) exposure to covid-19: Secondary | ICD-10-CM

## 2019-01-27 LAB — NOVEL CORONAVIRUS, NAA: SARS-CoV-2, NAA: DETECTED — AB

## 2019-02-17 ENCOUNTER — Ambulatory Visit: Payer: PRIVATE HEALTH INSURANCE | Attending: Internal Medicine

## 2019-02-17 DIAGNOSIS — Z20822 Contact with and (suspected) exposure to covid-19: Secondary | ICD-10-CM

## 2019-02-18 LAB — NOVEL CORONAVIRUS, NAA: SARS-CoV-2, NAA: NOT DETECTED
